# Patient Record
Sex: Female | Born: 1970
Health system: Southern US, Community
[De-identification: ages and names within clinical notes are randomized; demographics above are authoritative.]

## PROBLEM LIST (undated history)

## (undated) DIAGNOSIS — F329 Major depressive disorder, single episode, unspecified: Secondary | ICD-10-CM

## (undated) DIAGNOSIS — N920 Excessive and frequent menstruation with regular cycle: Secondary | ICD-10-CM

## (undated) DIAGNOSIS — K5730 Diverticulosis of large intestine without perforation or abscess without bleeding: Secondary | ICD-10-CM

## (undated) DIAGNOSIS — D250 Submucous leiomyoma of uterus: Secondary | ICD-10-CM

## (undated) DIAGNOSIS — K5909 Other constipation: Secondary | ICD-10-CM

## (undated) DIAGNOSIS — M1990 Unspecified osteoarthritis, unspecified site: Secondary | ICD-10-CM

## (undated) DIAGNOSIS — D649 Anemia, unspecified: Secondary | ICD-10-CM

## (undated) DIAGNOSIS — Z973 Presence of spectacles and contact lenses: Secondary | ICD-10-CM

## (undated) DIAGNOSIS — F32A Depression, unspecified: Secondary | ICD-10-CM

## (undated) DIAGNOSIS — K921 Melena: Secondary | ICD-10-CM

## (undated) DIAGNOSIS — F419 Anxiety disorder, unspecified: Secondary | ICD-10-CM

## (undated) DIAGNOSIS — K5792 Diverticulitis of intestine, part unspecified, without perforation or abscess without bleeding: Secondary | ICD-10-CM

## (undated) HISTORY — DX: Melena: K92.1

## (undated) HISTORY — PX: BREAST EXCISIONAL BIOPSY: SUR124

## (undated) HISTORY — PX: FOOT SURGERY: SHX648

## (undated) HISTORY — DX: Major depressive disorder, single episode, unspecified: F32.9

## (undated) HISTORY — DX: Unspecified osteoarthritis, unspecified site: M19.90

## (undated) HISTORY — PX: KIDNEY SURGERY: SHX687

## (undated) HISTORY — DX: Depression, unspecified: F32.A

---

## 2015-03-13 ENCOUNTER — Emergency Department (HOSPITAL_COMMUNITY)
Admission: EM | Admit: 2015-03-13 | Discharge: 2015-03-13 | Discharge status: Against Medical Advice | Payer: Self-pay | Attending: Emergency Medicine | Admitting: Emergency Medicine

## 2015-03-13 ENCOUNTER — Encounter (HOSPITAL_COMMUNITY): Payer: Self-pay | Admitting: Emergency Medicine

## 2015-03-13 DIAGNOSIS — R109 Unspecified abdominal pain: Secondary | ICD-10-CM | POA: Insufficient documentation

## 2015-03-13 HISTORY — DX: Anxiety disorder, unspecified: F41.9

## 2015-03-13 HISTORY — DX: Diverticulitis of intestine, part unspecified, without perforation or abscess without bleeding: K57.92

## 2015-03-13 LAB — CBC
HCT: 35.6 % — ABNORMAL LOW (ref 36.0–46.0)
Hemoglobin: 11.5 g/dL — ABNORMAL LOW (ref 12.0–15.0)
MCH: 28.3 pg (ref 26.0–34.0)
MCHC: 32.3 g/dL (ref 30.0–36.0)
MCV: 87.7 fL (ref 78.0–100.0)
Platelets: 225 10*3/uL (ref 150–400)
RBC: 4.06 MIL/uL (ref 3.87–5.11)
RDW: 13.6 % (ref 11.5–15.5)
WBC: 6.6 10*3/uL (ref 4.0–10.5)

## 2015-03-13 LAB — BASIC METABOLIC PANEL
Anion gap: 8 (ref 5–15)
BUN: 13 mg/dL (ref 6–20)
CO2: 24 mmol/L (ref 22–32)
Calcium: 9.1 mg/dL (ref 8.9–10.3)
Chloride: 106 mmol/L (ref 101–111)
Creatinine, Ser: 0.77 mg/dL (ref 0.44–1.00)
GFR calc Af Amer: >60 mL/min (ref >60)
GFR calc non Af Amer: >60 mL/min (ref >60)
Glucose, Bld: 83 mg/dL (ref 65–99)
Potassium: 3.7 mmol/L (ref 3.5–5.1)
Sodium: 138 mmol/L (ref 135–145)

## 2015-03-13 LAB — I-STAT BETA HCG BLOOD, ED (MC, WL, AP ONLY): I-stat hCG, quantitative: <5 m[IU]/mL (ref <5)

## 2015-03-13 NOTE — ED Notes (Signed)
Pt told registration she was leaving prior to being seen

## 2015-03-13 NOTE — ED Notes (Signed)
Pt reports L flank pain since yesterday. Pt thinks she saw some blood in your urine. No hx of kidney stones. Hx of ureteral deterioration several years ago.

## 2015-11-26 ENCOUNTER — Ambulatory Visit (INDEPENDENT_AMBULATORY_CARE_PROVIDER_SITE_OTHER): Payer: Self-pay

## 2015-11-26 ENCOUNTER — Ambulatory Visit (INDEPENDENT_AMBULATORY_CARE_PROVIDER_SITE_OTHER): Payer: Self-pay | Admitting: Urgent Care

## 2015-11-26 VITALS — BP 148/85 | HR 64 | Temp 98.8°F | Resp 16

## 2015-11-26 DIAGNOSIS — S91119A Laceration without foreign body of unspecified toe without damage to nail, initial encounter: Secondary | ICD-10-CM

## 2015-11-26 DIAGNOSIS — Z0289 Encounter for other administrative examinations: Secondary | ICD-10-CM

## 2015-11-26 DIAGNOSIS — S99921A Unspecified injury of right foot, initial encounter: Secondary | ICD-10-CM

## 2015-11-26 DIAGNOSIS — Z23 Encounter for immunization: Secondary | ICD-10-CM

## 2015-11-26 DIAGNOSIS — S6981XA Other specified injuries of right wrist, hand and finger(s), initial encounter: Secondary | ICD-10-CM | POA: Diagnosis not present

## 2015-11-26 MED ORDER — TRAMADOL HCL 50 MG PO TABS: 50.0000 mg | ORAL_TABLET | Freq: Three times a day (TID) | ORAL | 0 refills | Status: DC | PRN

## 2015-11-26 MED ORDER — CEPHALEXIN 500 MG PO CAPS: 500.0000 mg | ORAL_CAPSULE | Freq: Three times a day (TID) | ORAL | 0 refills | Status: DC

## 2015-11-26 NOTE — Patient Instructions (Addendum)
Laceration Care, Adult A laceration is a cut that goes through all of the layers of the skin and into the tissue that is right under the skin. Some lacerations heal on their own. Others need to be closed with stitches (sutures), staples, skin adhesive strips, or skin glue. Proper laceration care minimizes the risk of infection and helps the laceration to heal better. HOW TO CARE FOR YOUR LACERATION If sutures or staples were used:  Keep the wound clean and dry.  If you were given a bandage (dressing), you should change it at least one time per day or as told by your health care provider. You should also change it if it becomes wet or dirty.  Keep the wound completely dry for the first 24 hours or as told by your health care provider. After that time, you may shower or bathe. However, make sure that the wound is not soaked in water until after the sutures or staples have been removed.  Clean the wound one time each day or as told by your health care provider:  Wash the wound with soap and water.  Rinse the wound with water to remove all soap.  Pat the wound dry with a clean towel. Do not rub the wound.  After cleaning the wound, apply a thin layer of antibiotic ointmentas told by your health care provider. This will help to prevent infection and keep the dressing from sticking to the wound.  Have the sutures or staples removed as told by your health care provider. If skin adhesive strips were used:  Keep the wound clean and dry.  If you were given a bandage (dressing), you should change it at least one time per day or as told by your health care provider. You should also change it if it becomes dirty or wet.  Do not get the skin adhesive strips wet. You may shower or bathe, but be careful to keep the wound dry.  If the wound gets wet, pat it dry with a clean towel. Do not rub the wound.  Skin adhesive strips fall off on their own. You may trim the strips as the wound heals. Do not  remove skin adhesive strips that are still stuck to the wound. They will fall off in time. If skin glue was used:  Try to keep the wound dry, but you may briefly wet it in the shower or bath. Do not soak the wound in water, such as by swimming.  After you have showered or bathed, gently pat the wound dry with a clean towel. Do not rub the wound.  Do not do any activities that will make you sweat heavily until the skin glue has fallen off on its own.  Do not apply liquid, cream, or ointment medicine to the wound while the skin glue is in place. Using those may loosen the film before the wound has healed.  If you were given a bandage (dressing), you should change it at least one time per day or as told by your health care provider. You should also change it if it becomes dirty or wet.  If a dressing is placed over the wound, be careful not to apply tape directly over the skin glue. Doing that may cause the glue to be pulled off before the wound has healed.  Do not pick at the glue. The skin glue usually remains in place for 5-10 days, then it falls off of the skin. General Instructions  Take over-the-counter and prescription   medicines only as told by your health care provider.  If you were prescribed an antibiotic medicine or ointment, take or apply it as told by your doctor. Do not stop using it even if your condition improves.  To help prevent scarring, make sure to cover your wound with sunscreen whenever you are outside after stitches are removed, after adhesive strips are removed, or when glue remains in place and the wound is healed. Make sure to wear a sunscreen of at least 30 SPF.  Do not scratch or pick at the wound.  Keep all follow-up visits as told by your health care provider. This is important.  Check your wound every day for signs of infection. Watch for:  Redness, swelling, or pain.  Fluid, blood, or pus.  Raise (elevate) the injured area above the level of your heart  while you are sitting or lying down, if possible. SEEK MEDICAL CARE IF:  You received a tetanus shot and you have swelling, severe pain, redness, or bleeding at the injection site.  You have a fever.  A wound that was closed breaks open.  You notice a bad smell coming from your wound or your dressing.  You notice something coming out of the wound, such as wood or glass.  Your pain is not controlled with medicine.  You have increased redness, swelling, or pain at the site of your wound.  You have fluid, blood, or pus coming from your wound.  You notice a change in the color of your skin near your wound.  You need to change the dressing frequently due to fluid, blood, or pus draining from the wound.  You develop a new rash.  You develop numbness around the wound. SEEK IMMEDIATE MEDICAL CARE IF:  You develop severe swelling around the wound.  Your pain suddenly increases and is severe.  You develop painful lumps near the wound or on skin that is anywhere on your body.  You have a red streak going away from your wound.  The wound is on your hand or foot and you cannot properly move a finger or toe.  The wound is on your hand or foot and you notice that your fingers or toes look pale or bluish.   This information is not intended to replace advice given to you by your health care provider. Make sure you discuss any questions you have with your health care provider.   Document Released: 03/03/2005 Document Revised: 07/18/2014 Document Reviewed: 02/27/2014 Elsevier Interactive Patient Education 2016 [place] or Toenail Removal Fingernail or toenail removal is a surgical procedure to take off a nail from your finger or your toe. You may need to have a fingernail or toenail removed if it has an abnormal shape (deformity) or if it is severely injured. A fingernail or toenail may also be removed due to a bacterial infection, a severe ingrown toenail, or a  fungal infection that has failed treatment with antifungal medicines. LET [facility] CARE PROVIDER KNOW ABOUT:  Any allergies you have.  All medicines you are taking, including vitamins, herbs, eye drops, creams, and over-the-counter medicines.  Previous problems you or members of your family have had with the use of anesthetics.  Any blood disorders you have.  Previous surgeries you have had.  Any medical conditions you may have. RISKS AND COMPLICATIONS Generally, this is a safe procedure. However, problems may occur, including:  Pain.  Bleeding.  Infection.  Regrowth of a deformed nail. BEFORE THE  PROCEDURE  Ask your health care provider about changing or stopping your regular medicines. This is especially important if you are taking diabetes medicines or blood thinners.  Follow instructions from your health care provider about eating or drinking restrictions.  Plan to have someone take you home after the procedure. PROCEDURE  An IV tube will be inserted into one of your veins.  You will be given one or more of the following:  A medicine that helps you relax (sedative).  A medicine that numbs the area (local anesthetic).  After your toe or finger is numb, your health care provider will insert a blunt instrument under your nail to lift it up.  In some cases, your health care provider may also make a cut (incision) in your nail.  After your nail is lifted away from your toe or finger, your health care provider will detach it from your nail bed.  A germ-killing bandage (antiseptic dressing) will be put on your toe or finger. The procedure may vary among health care providers and hospitals. AFTER THE PROCEDURE  Your blood pressure, heart rate, breathing rate, and blood oxygen level will be monitored often until the medicines you were given have worn off.  It is common to have some pain after nail removal. You will be given pain medicine as needed.  You may be  given a prescription for pain medicine and antibiotic medicine.  If you had a toenail removed, you will be given a surgical shoe to wear while you recover.  If you had a fingernail removed, you may be given a finger splint to wear while you recover.   This information is not intended to replace advice given to you by your health care provider. Make sure you discuss any questions you have with your health care provider.   Document Released: 11/30/2002 Document Revised: 07/18/2014 Document Reviewed: 03/01/2014 Elsevier Interactive Patient Education 2016 [employer].    IF you received an x-ray today, you will receive an invoice from [facility]. Please contact [facility] at [phone] with questions or concerns regarding your invoice.   IF you received labwork today, you will receive an invoice from [employer]. Please contact [facility] at [phone] with questions or concerns regarding your invoice.   Our billing staff will not be able to assist you with questions regarding bills from these companies.  You will be contacted with the lab results as soon as they are available. The fastest way to get your results is to activate your My Chart account. Instructions are located on the last page of this paperwork. If you have not heard from [place] regarding the results in 2 weeks, please contact this office.

## 2015-11-26 NOTE — Progress Notes (Signed)
Partial Toe Nail Removal and Laceration repair  Verbal informed consent obtained. Patient was anesthetized and right toe irrigated prior to procedure by [employer].  Right outer lateral to medial portion of toe nail removed without further tearing of the nailbed.  Two interrupted nylon, dissolvable, sutures placed to nail bed laceration. Two 4.0 prolene interrupted sutures placed right outer upper lateral toe. Hemostasis achieved.Dressed with guaze and secured with coban. Patient tolerated procedure.  Sheri [name]. Kenton Kingfisher, MSN, FNP-C [facility]

## 2015-11-26 NOTE — Progress Notes (Signed)
    MRN: [id number] DOB: 05-07-1970  Subjective:   Sheri [name] is a 45 y.o. female presenting for chief complaint of Toe Injury (right toe, hit it on door at work )  Reports suffering a toe injury while walking into her building at work today. Patient opened the door and trapped her right great toe underneath it. She gently removed her toe from under the door and noticed her nail had broken. She started to bleed profusely and has had worsening pain since then. The patient was able to clean her toe with iodine and wrapped it in gauze prior to coming in. She reports her pain level at 5/10 with shooting, sharp pain that goes to 10/10. She cannot recall when her last TDAP was.   [facility] currently has no medications in their medication list. Also has No Known Allergies.  Sheri [name]  has a past medical history of Anxiety; Arthritis; Depression; and Diverticulitis. Also  has a past surgical history that includes Kidney surgery and Foot surgery.  Objective:   Vitals: BP (!) 148/85 (BP Location: Left Arm, Patient Position: Sitting, Cuff Size: Normal)   Pulse 64   Temp 98.8 F (37.1 C) (Oral)   Resp 16   LMP 11/20/2015   SpO2 100%   Physical Exam  Constitutional: She is oriented to person, place, and time. She appears well-developed and well-nourished.  Cardiovascular: Normal rate.   Pulmonary/Chest: Effort normal.  Musculoskeletal:       Right foot: There is decreased range of motion, tenderness, swelling (trace) and laceration (over medial right great toe with associated nail bed laceration).  Neurological: She is alert and oriented to person, place, and time.   Dg Toe Great Right  Result Date: 11/26/2015 CLINICAL DATA:  Crush injury to the toe at work. EXAM: RIGHT GREAT TOE COMPARISON:  None. FINDINGS: Distal phalanx is normal. There is chronic arthropathy at the metatarsal phalangeal joint with an old surgical screw in the distal metatarsal. There are osteophytes circumferentially affecting this  joint. Medially, there is a linear lucency in the osteophyte that could be chronic or represent an osteophyte fracture. IMPRESSION: Chronic degenerative arthropathy at the metatarsal phalangeal joint of the great toe. Linear lucency within the medial osteophyte of the proximal phalanx which could be chronic and nontraumatic or could represent an osteophyte fracture. Electronically Signed   By: Nelson Chimes M.D.   On: 11/26/2015 11:28   Assessment and Plan :   1. Toe injury, right, initial encounter 2. Toe laceration, initial encounter 3. Nailbed injury, right, initial encounter 4. Need for Tdap vaccination - Laceration repaired by Nurse Practitioner Lavell Anchors under the direct supervision of PA-Chelle Jacqulynn Cadet, procedure note to follow. Wound care reviewed. Start Keflex given possible osteophyte fracture. Use ibuprofen and APAP for pain and inflammation, Tramadol for breakthrough pain. Wear post-op shoe. Patient is to rtc in 1 week for recheck. TDAP updated today.  Jaynee Eagles, PA-C [facility] [phone] 11/26/2015 11:07 AM

## 2015-12-03 ENCOUNTER — Ambulatory Visit (INDEPENDENT_AMBULATORY_CARE_PROVIDER_SITE_OTHER): Payer: Worker's Compensation | Admitting: Physician Assistant

## 2015-12-03 VITALS — BP 122/70 | HR 70 | Temp 98.7°F | Resp 18

## 2015-12-03 DIAGNOSIS — S91119D Laceration without foreign body of unspecified toe without damage to nail, subsequent encounter: Secondary | ICD-10-CM

## 2015-12-03 DIAGNOSIS — S99921D Unspecified injury of right foot, subsequent encounter: Secondary | ICD-10-CM

## 2015-12-03 DIAGNOSIS — S6981XD Other specified injuries of right wrist, hand and finger(s), subsequent encounter: Secondary | ICD-10-CM

## 2015-12-03 NOTE — Patient Instructions (Addendum)
WOUND CARE  . Every 24 hours, remove bandage and wash wound gently with mild soap and warm water. Reapply a new bandage after cleaning wound, if you will be going out and about. When you can (especially in the evenings at home), leave the wound open to air. . Continue daily cleansing with soap and water until The wound is healed. . You may apply any ointments or creams to the wound . Notify the office if you experience any of the following signs of infection: Swelling, redness, pus drainage, streaking, fever >101.0 F . Notify the office if you experience excessive bleeding that does not stop after 15-20 minutes of constant, firm pressure.      IF you received an x-ray today, you will receive an invoice from [facility]. Please contact [facility] at [phone] with questions or concerns regarding your invoice.   IF you received labwork today, you will receive an invoice from [employer]. Please contact [facility] at [phone] with questions or concerns regarding your invoice.   Our billing staff will not be able to assist you with questions regarding bills from these companies.  You will be contacted with the lab results as soon as they are available. The fastest way to get your results is to activate your My Chart account. Instructions are located on the last page of this paperwork. If you have not heard from [place] regarding the results in 2 weeks, please contact this office.

## 2015-12-03 NOTE — Progress Notes (Signed)
   Subjective:    Patient ID: Sheri [name], female    DOB: 11/16/1970, 45 y.o.   MRN: [id number] Chief Complaint  Patient presents with  . Suture / Staple Removal    HPI Patient presents today for suture removal. Patient was initially seen 11/26/2015 for an injury to the right 1st toe she sustained at work. On the day she first came in she received an x-ray which displayed possible fracture of an osteophyte in the great right toe. She was schedule today for a follow-up, patient has not changed dressing in 7 days, bandage adhered to nail bed and had to soak foot to remove. Ste of suture is healing well. Still has occasional throbbing pain that is relieved by tylenol, has been using right post-op shoe. She reports minor blood stains on dressing, and denies fever, drainage of pus or blood, wound dehiscence, edema.    Review of Systems No other significant ROS except those stated above  No Known Allergies Prior to Admission medications   Medication Sig Start Date End Date Taking? Authorizing Provider  cephALEXin (KEFLEX) 500 MG capsule Take 1 capsule (500 mg total) by mouth 3 (three) times daily. 11/26/15  Yes Jaynee Eagles, PA-C  traMADol (ULTRAM) 50 MG tablet Take 1 tablet (50 mg total) by mouth every 8 (eight) hours as needed for severe pain. 11/26/15  Yes Jaynee Eagles, PA-C   There are no active problems to display for this patient.      Objective:   Physical Exam  BP 122/70 (BP Location: Right Arm, Patient Position: Sitting, Cuff Size: Small)   Pulse 70   Temp 98.7 F (37.1 C) (Oral)   Resp 18   LMP 11/20/2015   SpO2 99%  Constitutional: Well-appearing female, no distress  HEENT:  Head: Normocephalic, atraumatic   Skin: The right great toe was missing 1/4 of the toenail sustained from the original injury. Slight bleeding occurred after removal of the wrapping. No erythema, edema, or drainage. No signs of infection, tissue is granulating well.       Assessment & Plan:  1. Suture  removal/Follow-up Patient's nail bed is healing well. 2  Etheylon sutures were removed from great toe. Patient no longer needs to come in for follow-up. Wound care instructions were given

## 2015-12-05 NOTE — Progress Notes (Signed)
Chief Complaint  Patient presents with  . Suture / Staple Removal    History of Present Illness: Patient presents for suture removal.  She was seen following injury to the RIGHT great toe at work on 11/26/2015.  Xray at the initial visit revealed possible acute fracture of an osteophyte of the toe, and so in addition to the wound repair, she was placed in a post-op shoe.  She presents today, as instructed, for suture removal. Unfortunately, she did not understand the instructions to remove the dressing after 24 hours and to wash daily. The original dressing remains in place.  Intermittent throbbing pain is relieved by OTC acetaminophen. No fever, chills.   Medications, allergies, active problem list and history reviewed.   Physical Exam  Constitutional: She is oriented to person, place, and time. She appears well-developed and well-nourished. She is active and cooperative. No distress.  BP 122/70 (BP Location: Right Arm, Patient Position: Sitting, Cuff Size: Small)   Pulse 70   Temp 98.7 F (37.1 C) (Oral)   Resp 18   LMP 11/20/2015   SpO2 99%    Eyes: Conjunctivae are normal.  Pulmonary/Chest: Effort normal.  Neurological: She is alert and oriented to person, place, and time.  Psychiatric: She has a normal mood and affect. Her speech is normal and behavior is normal.   Dressing is adherent to the nail bed by dried blood. The toe was soaked in water, and then the dressing was removed with minimal discomfort.  The wound is healing well, granulating. No erythema, edema, drainage, induration. Mildly tender.  Ethilon sutures removed without incidence. Vicryl sutures of the nail bed wound are not visible and wound there appears completely healed.  Cleansed and dry dressing applied.   ASSESSMENT & PLAN:  1. Toe injury, right, subsequent encounter 2. Toe laceration, subsequent encounter 3. Nailbed injury, right, subsequent encounter Local wound care reviewed again. May apply  OTC antibiotic ointment. Recommend leaving the wound open to the air when she is at home, covering with a simple bandaid at work. Continue in post-op shoe for another week, and test weight bearing without the shoe when bathing. She may gradually reduce use as able without pain.   Fara Chute, PA-C Physician Assistant-[facility]ified [facility]

## 2017-10-01 ENCOUNTER — Encounter (HOSPITAL_BASED_OUTPATIENT_CLINIC_OR_DEPARTMENT_OTHER): Payer: Self-pay

## 2017-10-01 ENCOUNTER — Emergency Department (HOSPITAL_BASED_OUTPATIENT_CLINIC_OR_DEPARTMENT_OTHER)
Admission: EM | Admit: 2017-10-01 | Discharge: 2017-10-01 | Disposition: A | Discharge status: Home / Self Care | Payer: BLUE CROSS/BLUE SHIELD | Attending: Emergency Medicine | Admitting: Emergency Medicine

## 2017-10-01 ENCOUNTER — Other Ambulatory Visit: Payer: Self-pay

## 2017-10-01 DIAGNOSIS — J209 Acute bronchitis, unspecified: Secondary | ICD-10-CM | POA: Insufficient documentation

## 2017-10-01 DIAGNOSIS — Z79899 Other long term (current) drug therapy: Secondary | ICD-10-CM | POA: Insufficient documentation

## 2017-10-01 DIAGNOSIS — R05 Cough: Secondary | ICD-10-CM | POA: Diagnosis present

## 2017-10-01 MED ORDER — AZITHROMYCIN 250 MG PO TABS: ORAL_TABLET | ORAL | 0 refills | Status: DC

## 2017-10-01 NOTE — Discharge Instructions (Addendum)
Zithromax as prescribed.  Continue over-the-counter medications as needed for symptom relief.  Return to the emergency department if symptoms significantly worsen or change.

## 2017-10-01 NOTE — ED Triage Notes (Addendum)
C/o flu like sx x 1 week-NAD-steady gait 

## 2017-10-01 NOTE — ED Provider Notes (Signed)
[facility] EMERGENCY DEPARTMENT Provider Note   CSN: [id number] Arrival date & time: 10/01/17  1711     History   Chief Complaint Chief Complaint  Patient presents with  . Cough    HPI Sheri [name] is a 47 y.o. female.  Patient is a 47 year old female with history of anxiety and diverticulitis.  She presents with URI symptoms for the past 10 days.  She reports returning home from a cruise shortly prior to the onset of her illness.  She reports congestion in her chest and cough that is nonproductive.  She denies any shortness of breath.  She does report fevers and chills during this period of time.  She has tried Mucinex over-the-counter with little relief.  The history is provided by the patient.  Cough  This is a new problem. Episode onset: 10 days ago. The problem occurs constantly. The problem has been gradually worsening. The cough is non-productive. Maximum temperature: Feels chilled, however has not taken temperature. Associated symptoms include chills. Pertinent negatives include no shortness of breath and no wheezing. She has tried nothing for the symptoms.    Past Medical History:  Diagnosis Date  . Anxiety   . Arthritis   . Depression   . Diverticulitis     There are no active problems to display for this patient.   Past Surgical History:  Procedure Laterality Date  . FOOT SURGERY    . KIDNEY SURGERY       OB History   None      Home Medications    Prior to Admission medications   Medication Sig Start Date End Date Taking? Authorizing Provider  cephALEXin (KEFLEX) 500 MG capsule Take 1 capsule (500 mg total) by mouth 3 (three) times daily. 11/26/15   Jaynee Eagles, PA-C  traMADol (ULTRAM) 50 MG tablet Take 1 tablet (50 mg total) by mouth every 8 (eight) hours as needed for severe pain. 11/26/15   Jaynee Eagles, PA-C    Family History No family history on file.  Social History Social History   Tobacco Use  . Smoking status: Never Smoker  .  Smokeless tobacco: Never Used  Substance Use Topics  . Alcohol use: No  . Drug use: No     Allergies   Patient has no known allergies.   Review of Systems Review of Systems  Constitutional: Positive for chills.  Respiratory: Positive for cough. Negative for shortness of breath and wheezing.   All other systems reviewed and are negative.    Physical Exam Updated Vital Signs BP (!) 148/80 (BP Location: Left Arm)   Pulse 85   Temp 98.8 F (37.1 C) (Oral)   Resp 18   Ht 5\' 5"  (1.651 m)   Wt 83.5 kg (184 lb)   LMP 09/21/2017   SpO2 100%   BMI 30.62 kg/m   Physical Exam  Constitutional: She is oriented to person, place, and time. She appears well-developed and well-nourished. No distress.  HENT:  Head: Normocephalic and atraumatic.  Mouth/Throat: Oropharynx is clear and moist.  Neck: Normal range of motion. Neck supple.  Cardiovascular: Normal rate and regular rhythm. Exam reveals no gallop and no friction rub.  No murmur heard. Pulmonary/Chest: Effort normal and breath sounds normal. No respiratory distress. She has no wheezes.  Abdominal: Soft. Bowel sounds are normal. She exhibits no distension. There is no tenderness.  Musculoskeletal: Normal range of motion.  Neurological: She is alert and oriented to person, place, and time.  Skin: Skin is  warm and dry. She is not diaphoretic.  Nursing note and vitals reviewed.    ED Treatments / Results  Labs (all labs ordered are listed, but only abnormal results are displayed) Labs Reviewed - No data to display  EKG None  Radiology No results found.  Procedures Procedures (including critical care time)  Medications Ordered in ED Medications - No data to display   Initial Impression / Assessment and Plan / ED Course  I have reviewed the triage vital signs and the nursing notes.  Pertinent labs & imaging results that were available during my care of the patient were reviewed by me and considered in my medical  decision making (see chart for details).  Patient with URI symptoms that have been persistent and worsening for the past 10 days.  She has had no relief with over-the-counter medications.  This will be treated as a bronchitis with Zithromax.  She is to continue over-the-counter medications and return/follow-up as needed.  Final Clinical Impressions(s) / ED Diagnoses   Final diagnoses:  None    ED Discharge Orders    None       Veryl Speak, MD 10/01/17 1743

## 2017-10-13 ENCOUNTER — Emergency Department (HOSPITAL_COMMUNITY)
Admission: EM | Admit: 2017-10-13 | Discharge: 2017-10-13 | Disposition: A | Discharge status: Home / Self Care | Payer: BLUE CROSS/BLUE SHIELD | Attending: Emergency Medicine | Admitting: Emergency Medicine

## 2017-10-13 ENCOUNTER — Emergency Department (HOSPITAL_COMMUNITY): Payer: BLUE CROSS/BLUE SHIELD

## 2017-10-13 ENCOUNTER — Encounter (HOSPITAL_COMMUNITY): Payer: Self-pay

## 2017-10-13 DIAGNOSIS — R109 Unspecified abdominal pain: Secondary | ICD-10-CM | POA: Diagnosis not present

## 2017-10-13 DIAGNOSIS — R319 Hematuria, unspecified: Secondary | ICD-10-CM | POA: Insufficient documentation

## 2017-10-13 LAB — BASIC METABOLIC PANEL
Anion gap: 11 (ref 5–15)
BUN: 12 mg/dL (ref 6–20)
CO2: 19 mmol/L — ABNORMAL LOW (ref 22–32)
Calcium: 9.5 mg/dL (ref 8.9–10.3)
Chloride: 108 mmol/L (ref 98–111)
Creatinine, Ser: 1.08 mg/dL — ABNORMAL HIGH (ref 0.44–1.00)
GFR calc Af Amer: >60 mL/min (ref >60)
GFR calc non Af Amer: >60 mL/min (ref >60)
Glucose, Bld: 107 mg/dL — ABNORMAL HIGH (ref 70–99)
Potassium: 3.6 mmol/L (ref 3.5–5.1)
Sodium: 138 mmol/L (ref 135–145)

## 2017-10-13 LAB — CBC WITH DIFFERENTIAL/PLATELET
Abs Immature Granulocytes: 0.1 10*3/uL (ref 0.0–0.1)
Basophils Absolute: 0.1 10*3/uL (ref 0.0–0.1)
Basophils Relative: 1 %
Eosinophils Absolute: 0.1 10*3/uL (ref 0.0–0.7)
Eosinophils Relative: 1 %
HCT: 35.4 % — ABNORMAL LOW (ref 36.0–46.0)
Hemoglobin: 11.3 g/dL — ABNORMAL LOW (ref 12.0–15.0)
Immature Granulocytes: 0 %
Lymphocytes Relative: 12 %
Lymphs Abs: 1.4 10*3/uL (ref 0.7–4.0)
MCH: 29.3 pg (ref 26.0–34.0)
MCHC: 31.9 g/dL (ref 30.0–36.0)
MCV: 91.7 fL (ref 78.0–100.0)
Monocytes Absolute: 0.6 10*3/uL (ref 0.1–1.0)
Monocytes Relative: 5 %
Neutro Abs: 9.7 10*3/uL — ABNORMAL HIGH (ref 1.7–7.7)
Neutrophils Relative %: 81 %
Platelets: 249 10*3/uL (ref 150–400)
RBC: 3.86 MIL/uL — ABNORMAL LOW (ref 3.87–5.11)
RDW: 13.5 % (ref 11.5–15.5)
WBC: 12 10*3/uL — ABNORMAL HIGH (ref 4.0–10.5)

## 2017-10-13 LAB — URINALYSIS, ROUTINE W REFLEX MICROSCOPIC
Bacteria, UA: NONE SEEN
RBC / HPF: >50 RBC/hpf — ABNORMAL HIGH (ref 0–5)

## 2017-10-13 LAB — POC URINE PREG, ED: Preg Test, Ur: NEGATIVE

## 2017-10-13 LAB — PREGNANCY, URINE: Preg Test, Ur: NEGATIVE

## 2017-10-13 MED ORDER — ONDANSETRON HCL 4 MG/2ML IJ SOLN
4.0000 mg | Freq: Once | INTRAMUSCULAR | Status: AC
  Administered 2017-10-13: 4 mg via INTRAVENOUS
  Filled 2017-10-13: qty 2

## 2017-10-13 MED ORDER — HYDROMORPHONE HCL 1 MG/ML IJ SOLN
1.0000 mg | Freq: Once | INTRAMUSCULAR | Status: AC
  Administered 2017-10-13: 1 mg via INTRAVENOUS
  Filled 2017-10-13: qty 1

## 2017-10-13 MED ORDER — ONDANSETRON HCL 4 MG/2ML IJ SOLN
4.0000 mg | Freq: Once | INTRAMUSCULAR | Status: AC
  Administered 2017-10-13: 4 mg via INTRAVENOUS

## 2017-10-13 MED ORDER — ONDANSETRON 4 MG PO TBDP: 4.0000 mg | ORAL_TABLET | Freq: Three times a day (TID) | ORAL | 0 refills | Status: DC | PRN

## 2017-10-13 MED ORDER — HYDROMORPHONE HCL 1 MG/ML IJ SOLN
1.0000 mg | Freq: Once | INTRAMUSCULAR | Status: AC
  Administered 2017-10-13: 1 mg via INTRAVENOUS

## 2017-10-13 MED ORDER — OXYCODONE-ACETAMINOPHEN 5-325 MG PO TABS: 1.0000 | ORAL_TABLET | ORAL | 0 refills | Status: DC | PRN

## 2017-10-13 NOTE — ED Notes (Signed)
POC not crossing over, negative. Pregnancy sent to main lab for result.

## 2017-10-13 NOTE — ED Notes (Signed)
CT called and made aware.

## 2017-10-13 NOTE — Discharge Instructions (Signed)
Take the prescribed medication as directed. Follow-up with urology-- call office today to schedule appt. Return to the ED for new or worsening symptoms.

## 2017-10-13 NOTE — ED Provider Notes (Signed)
[facility] EMERGENCY DEPARTMENT Provider Note   CSN: [id number] Arrival date & time: 10/13/17  0139     History   Chief Complaint Chief Complaint  Patient presents with  . Hematuria  . Flank Pain    HPI Sheri [name] is a 47 y.o. female.  The history is provided by the patient and medical records.  Hematuria   Flank Pain     47 y.o. F with hx of anxiety, arthritis, depression, diverticulitis, presenting to the ED for left flank pain.  Patient reports for the past week she has been having intermittent right flank pain.  States tonight it got acutely worse, has been more constant, and now having hematuria.  States blood bright red in color with passage of clots.  Pain remains localized to left flank, no radiation.  Reports nausea but denies vomiting.  No fever/chills.  States she had hx of similar several years ago when still living in [place].  Seen in the ED there, admitted but never told what it was but reports they ruled out kidney stones.   Past Medical History:  Diagnosis Date  . Anxiety   . Arthritis   . Depression   . Diverticulitis     There are no active problems to display for this patient.   Past Surgical History:  Procedure Laterality Date  . FOOT SURGERY    . KIDNEY SURGERY       OB History   None      Home Medications    Prior to Admission medications   Medication Sig Start Date End Date Taking? Authorizing Provider  azithromycin (ZITHROMAX Z-PAK) 250 MG tablet 2 po day one, then 1 daily x 4 days 10/01/17   Veryl Speak, MD  cephALEXin (KEFLEX) 500 MG capsule Take 1 capsule (500 mg total) by mouth 3 (three) times daily. 11/26/15   Jaynee Eagles, PA-C  traMADol (ULTRAM) 50 MG tablet Take 1 tablet (50 mg total) by mouth every 8 (eight) hours as needed for severe pain. 11/26/15   Jaynee Eagles, PA-C    Family History No family history on file.  Social History Social History   Tobacco Use  . Smoking status: Never Smoker  . Smokeless  tobacco: Never Used  Substance Use Topics  . Alcohol use: No  . Drug use: No     Allergies   Patient has no known allergies.   Review of Systems Review of Systems  Genitourinary: Positive for flank pain and hematuria.  All other systems reviewed and are negative.    Physical Exam Updated Vital Signs BP (!) 164/98 (BP Location: Right Arm)   Pulse 88   Temp 99.7 F (37.6 C) (Oral)   Resp 18   Ht 5\' 5"  (1.651 m)   LMP 09/21/2017   SpO2 98%   BMI 30.62 kg/m   Physical Exam  Constitutional: She is oriented to person, place, and time. She appears well-developed and well-nourished.  Appears uncomfortable, wincing  HENT:  Head: Normocephalic and atraumatic.  Mouth/Throat: Oropharynx is clear and moist.  Eyes: Pupils are equal, round, and reactive to light. Conjunctivae and EOM are normal.  Neck: Normal range of motion.  Cardiovascular: Normal rate, regular rhythm and normal heart sounds.  Pulmonary/Chest: Effort normal and breath sounds normal. No stridor. No respiratory distress.  Abdominal: Soft. Bowel sounds are normal. There is no tenderness. There is CVA tenderness (left). There is no rebound.  Musculoskeletal: Normal range of motion.  Neurological: She is alert and oriented  to person, place, and time.  Skin: Skin is warm and dry.  Psychiatric: She has a normal mood and affect.  Nursing note and vitals reviewed.    ED Treatments / Results  Labs (all labs ordered are listed, but only abnormal results are displayed) Labs Reviewed  URINALYSIS, ROUTINE W REFLEX MICROSCOPIC - Abnormal; Notable for the following components:      Result Value   Color, Urine RED (*)    APPearance TURBID (*)    Glucose, UA   (*)    Value: TEST NOT REPORTED DUE TO COLOR INTERFERENCE OF URINE PIGMENT   Hgb urine dipstick   (*)    Value: TEST NOT REPORTED DUE TO COLOR INTERFERENCE OF URINE PIGMENT   Bilirubin Urine   (*)    Value: TEST NOT REPORTED DUE TO COLOR INTERFERENCE OF URINE  PIGMENT   Ketones, ur   (*)    Value: TEST NOT REPORTED DUE TO COLOR INTERFERENCE OF URINE PIGMENT   Protein, ur   (*)    Value: TEST NOT REPORTED DUE TO COLOR INTERFERENCE OF URINE PIGMENT   Nitrite   (*)    Value: TEST NOT REPORTED DUE TO COLOR INTERFERENCE OF URINE PIGMENT   Leukocytes, UA   (*)    Value: TEST NOT REPORTED DUE TO COLOR INTERFERENCE OF URINE PIGMENT   RBC / HPF >50 (*)    All other components within normal limits  CBC WITH DIFFERENTIAL/PLATELET - Abnormal; Notable for the following components:   WBC 12.0 (*)    RBC 3.86 (*)    Hemoglobin 11.3 (*)    HCT 35.4 (*)    Neutro Abs 9.7 (*)    All other components within normal limits  BASIC METABOLIC PANEL - Abnormal; Notable for the following components:   CO2 19 (*)    Glucose, Bld 107 (*)    Creatinine, Ser 1.08 (*)    All other components within normal limits  URINE CULTURE  PREGNANCY, URINE  POC URINE PREG, ED    EKG None  Radiology Ct Renal Stone Study  Result Date: 10/13/2017 CLINICAL DATA:  Hematuria, LEFT flank pain with nausea and vomiting for a week. EXAM: CT ABDOMEN AND PELVIS WITHOUT CONTRAST TECHNIQUE: Multidetector CT imaging of the abdomen and pelvis was performed following the standard protocol without IV contrast. COMPARISON:  None. FINDINGS: LOWER CHEST: Lung bases are clear. The visualized heart size is normal. No pericardial effusion. HEPATOBILIARY: Normal. PANCREAS: Normal. SPLEEN: Normal. ADRENALS/URINARY TRACT: Kidneys are orthotopic, demonstrating normal size and morphology. Dense material at ureteropelvic junction resulting in mild hydronephrosis. No nephrolithiasis; limited assessment for renal masses by nonenhanced CT. The unopacified ureters are normal in course and caliber. Urinary bladder is partially distended and unremarkable. Normal adrenal glands. STOMACH/BOWEL: Mild gas distended distal esophagus associated with reflux. The stomach, small and large bowel are normal in course and  caliber without inflammatory changes, sensitivity decreased by lack of enteric contrast. Normal appendix. VASCULAR/LYMPHATIC: Aortoiliac vessels are normal in course and caliber. Mild colonic diverticulosis. No lymphadenopathy by CT size criteria. REPRODUCTIVE: Enlarged lobulated uterus. Subcentimeter exophytic subserosal leiomyoma. IUD in lower uterine segment. Phlebolith LEFT ovarian vein OTHER: No intraperitoneal free fluid or free air. MUSCULOSKELETAL: Non-acute. IMPRESSION: 1. Mild hydronephrosis with dense clot at ureteropelvic junction. Recommend follow-up as hemorrhagic mass is possible. 2. No urolithiasis. 3. Enlarged likely leiomyomatous uterus. IUD in lower uterine segment, recommend non emergent gynecologic consultation. Electronically Signed   By: Elon Alas M.D.   On: 10/13/2017  05:04    Procedures Procedures (including critical care time)  Medications Ordered in ED Medications  HYDROmorphone (DILAUDID) injection 1 mg (1 mg Intravenous Given 10/13/17 0236)  ondansetron (ZOFRAN) injection 4 mg (4 mg Intravenous Given 10/13/17 0236)  HYDROmorphone (DILAUDID) injection 1 mg (1 mg Intravenous Given 10/13/17 0525)  ondansetron (ZOFRAN) injection 4 mg (4 mg Intravenous Given 10/13/17 0525)     Initial Impression / Assessment and Plan / ED Course  I have reviewed the triage vital signs and the nursing notes.  Pertinent labs & imaging results that were available during my care of the patient were reviewed by me and considered in my medical decision making (see chart for details).  47 year old female presenting to the ED with hematuria and left flank pain.  Denies history of.  She is afebrile and nontoxic but does appear uncomfortable.  Left CVA tenderness.  Clinical concern for stone.  UA has been sent.  Will obtain basic labs and CT renal study.  Pain and nausea medications ordered.  Labs overall reassuring.  UA could not fully be analyzed due to amount of blood but no bacteria have  been visualized.  Culture pending.  CT with left-sided hydronephrosis with clot at the UPJ.  No visualized stone.  This may be source of patient's pain.  She will need close follow-up with urology as radiology reports hemorrhagic masses possible.  She reports some recurrence of her pain, will give additional meds here.  Will need continued symptomatic control at home as well.   She will return here for any new/acute changes.  Final Clinical Impressions(s) / ED Diagnoses   Final diagnoses:  Hematuria, unspecified type  Left flank pain    ED Discharge Orders        Ordered    oxyCODONE-acetaminophen (PERCOCET) 5-325 MG tablet  Every 4 hours PRN     10/13/17 0524    ondansetron (ZOFRAN ODT) 4 MG disintegrating tablet  Every 8 hours PRN     10/13/17 0524       Larene Pickett, PA-C 10/13/17 7124    Ezequiel Essex, MD 10/13/17 [phone]

## 2017-10-13 NOTE — ED Notes (Signed)
ED Provider at bedside. 

## 2017-10-13 NOTE — ED Triage Notes (Signed)
Pt states that for the past week she has been having hematuria and L sided flank pain with n/v

## 2017-10-14 LAB — URINE CULTURE

## 2018-05-24 ENCOUNTER — Encounter: Payer: Self-pay | Admitting: Family Medicine

## 2018-05-24 ENCOUNTER — Ambulatory Visit: Payer: Self-pay | Admitting: Family Medicine

## 2018-05-24 VITALS — BP 110/80 | HR 78 | Temp 99.1°F | Ht 64.0 in | Wt 184.0 lb

## 2018-05-24 DIAGNOSIS — B9789 Other viral agents as the cause of diseases classified elsewhere: Secondary | ICD-10-CM

## 2018-05-24 DIAGNOSIS — M1990 Unspecified osteoarthritis, unspecified site: Secondary | ICD-10-CM | POA: Diagnosis not present

## 2018-05-24 DIAGNOSIS — Z8659 Personal history of other mental and behavioral disorders: Secondary | ICD-10-CM | POA: Diagnosis not present

## 2018-05-24 DIAGNOSIS — Z8719 Personal history of other diseases of the digestive system: Secondary | ICD-10-CM | POA: Insufficient documentation

## 2018-05-24 DIAGNOSIS — Z7689 Persons encountering health services in other specified circumstances: Secondary | ICD-10-CM

## 2018-05-24 DIAGNOSIS — J069 Acute upper respiratory infection, unspecified: Secondary | ICD-10-CM | POA: Diagnosis not present

## 2018-05-24 DIAGNOSIS — K5909 Other constipation: Secondary | ICD-10-CM | POA: Diagnosis not present

## 2018-05-24 LAB — POCT INFLUENZA A/B
Influenza A, POC: NEGATIVE
Influenza B, POC: NEGATIVE

## 2018-05-24 MED ORDER — LINACLOTIDE 145 MCG PO CAPS: 145.0000 ug | ORAL_CAPSULE | Freq: Every day | ORAL | 3 refills | Status: DC

## 2018-05-24 MED ORDER — BENZONATATE 100 MG PO CAPS: 100.0000 mg | ORAL_CAPSULE | Freq: Two times a day (BID) | ORAL | 0 refills | Status: DC | PRN

## 2018-05-24 NOTE — Progress Notes (Signed)
Patient presents to clinic today to establish care.  SUBJECTIVE: PMH: Pt is a 48 yo female with pmh sig for arthritis, h/o depression, constipation, and diverticulosis.  Patient previously seen by Osborne Oman at [facility]  Acute concern: -endorses coughing, sneezing, sore throat, rhinorrhea, headache, low-grade fever  -x1 week. -Has tried DayQuil, cough drops -Sick contacts include coworkers -Denies recent travel abroad  Arthritis: -Mostly in feet. -Patient ran track in the past -Had a surgery on her right foot to "clean it out" -Does not typically have to take anything for the pain.  History of diverticulosis: -Noted on colonoscopy at age 37 for hemorrhoids -Notes history of constipation -Was on Linzess in the past which helped -May have a BM q. 1 to 2 weeks -Drinking 3-4 bottles of water per day -Trying to increase p.o. intake of vegetables  History of depression: -States has been dealing with all her life -Notes recent episode after moving from [place] to [place] for her fianc.  Patient states she ended up not getting married and experienced depressive episode. -Not currently on any medications -Was in counseling in the past  Allergies: NKDA  Social history: Patient is single.  She has a bachelor's degree and is currently employed as a Primary school teacher.  Patient endorses social alcohol use.  Patient denies tobacco and drug use.   Health Maintenance: Dental --[facility] --[facility] Immunizations --influenza vaccine 2019, TB test 2016 Colonoscopy --at age 89 Mammogram --unsure Pt is a G3P0 PAP --unsure  Family medical history: Mom-alive Dad-alive, diabetes, MI, HLD, heart disease, HTN, stroke Sister-Alive Sister-Alisha, alive PGM-deceased, cancer, diabetes  Past Medical History:  Diagnosis Date  . Anxiety   . Arthritis   . Blood in stool   . Depression   . Diverticulitis     Past Surgical History:  Procedure Laterality  Date  . FOOT SURGERY    . KIDNEY SURGERY      No current outpatient medications on file prior to visit.   No current facility-administered medications on file prior to visit.     No Known Allergies  History reviewed. No pertinent family history.  Social History   Socioeconomic History  . Marital status: Single    Spouse name: Not on file  . Number of children: Not on file  . Years of education: Not on file  . Highest education level: Not on file  Occupational History  . Not on file  Social Needs  . Financial resource strain: Not on file  . Food insecurity:    Worry: Not on file    Inability: Not on file  . Transportation needs:    Medical: Not on file    Non-medical: Not on file  Tobacco Use  . Smoking status: Never Smoker  . Smokeless tobacco: Never Used  Substance and Sexual Activity  . Alcohol use: No  . Drug use: No  . Sexual activity: Not on file  Lifestyle  . Physical activity:    Days per week: Not on file    Minutes per session: Not on file  . Stress: Not on file  Relationships  . Social connections:    Talks on phone: Not on file    Gets together: Not on file    Attends religious service: Not on file    Active member of club or organization: Not on file    Attends meetings of clubs or organizations: Not on file    Relationship status: Not on file  .  Intimate partner violence:    Fear of current or ex partner: Not on file    Emotionally abused: Not on file    Physically abused: Not on file    Forced sexual activity: Not on file  Other Topics Concern  . Not on file  Social History Narrative  . Not on file    ROS General: Denies chills, night sweats, changes in weight, changes in appetite  + fever HEENT: Denies ear pain, changes in vision  +HA, rhinorrhea, sore throat, sneezing CV: Denies CP, palpitations, SOB, orthopnea Pulm: Denies SOB, wheezing  + cough GI: Denies abdominal pain, nausea, vomiting, diarrhea  + constipation GU: Denies  dysuria, hematuria, frequency, vaginal discharge Msk: Denies muscle cramps  + joint pains Neuro: Denies weakness, numbness, tingling Skin: Denies rashes, bruising Psych: Denies anxiety, hallucinations  +depression  BP 110/80 (BP Location: Left Arm, Patient Position: Sitting, Cuff Size: Large)   Pulse 78   Temp 99.1 F (37.3 C) (Oral)   Ht 5\' 4"  (1.626 m)   Wt 184 lb (83.5 kg)   LMP 05/07/2018 (Exact Date)   SpO2 98%   BMI 31.58 kg/m   Physical Exam Gen. Pleasant, well developed, well-nourished, in NAD HEENT - [place]/AT, PERRL, no scleral icterus, no nasal drainage, pharynx without erythema or exudate, TMs full bilaterally.  Mild cervical lymphadenopathy. Lungs: no use of accessory muscles, CTAB, no wheezes, rales or rhonchi Cardiovascular: RRR, No r/g/m, no peripheral edema Musculoskeletal: No deformities, moves all four extremities, no cyanosis or clubbing, normal tone Neuro:  A&Ox3, CN II-XII intact, normal gait Skin:  Warm, dry, intact, no lesions  No results found for this or any previous visit (from the past 2160 hour(s)).  Assessment/Plan: Viral URI with cough  -supportive care.  Gargle with warm salt water or Chloraseptic spray, okay to use over-the-counter cold medications, Tylenol PRN for any pain, fever, or discomfort -given handout - Plan: benzonatate (TESSALON) 100 MG capsule, POC Influenza A/B  negative  Arthritis -stable -Tylenol prn, heat  History of diverticulosis -stable  Chronic constipation -Discussed increasing p.o. intake of water and fiber -Given handout -We will restart Linzess - Plan: linaclotide (LINZESS) 145 MCG CAPS capsule  History of depression -PHQ 9 score 15 -Gad 7 score 3 -Discussed considering restarting counseling  Encounter to establish care -We reviewed the PMH, PSH, FH, SH, Meds and Allergies. -We provided refills for any medications we will prescribe as needed. -We addressed current concerns per orders and patient  instructions. -We have asked for records for pertinent exams, studies, vaccines and notes from previous providers. -We have advised patient to follow up per instructions below.  F/u prn in the next few months  Grier Mitts, MD

## 2018-05-24 NOTE — Patient Instructions (Signed)
Arthritis Arthritis is a term that is commonly used to refer to joint pain or joint disease. There are more than 100 types of arthritis. What are the causes? The most common cause of this condition is wear and tear of a joint. Other causes include:  Gout.  Inflammation of a joint.  An infection of a joint.  Sprains and other injuries near the joint.  A drug reaction or allergic reaction. In some cases, the cause may not be known. What are the signs or symptoms? The main symptom of this condition is pain in the joint with movement. Other symptoms include:  Redness, swelling, or stiffness at a joint.  Warmth coming from the joint.  Fever.  Overall feeling of illness. How is this diagnosed? This condition may be diagnosed with a physical exam and tests, including:  Blood tests.  Urine tests.  Imaging tests, such as MRI, X-rays, or a CT scan. Sometimes, fluid is removed from a joint for testing. How is this treated? Treatment for this condition may involve:  Treatment of the cause, if it is known.  Rest.  Raising (elevating) the joint.  Applying cold or hot packs to the joint.  Medicines to improve symptoms and reduce inflammation.  Injections of a steroid such as cortisone into the joint to help reduce pain and inflammation. Depending on the cause of your arthritis, you may need to make lifestyle changes to reduce stress on your joint. These changes may include exercising more and losing weight. Follow these instructions at home: Medicines  Take over-the-counter and prescription medicines only as told by your health care provider.  Do not take aspirin to relieve pain if gout is suspected. Activity  Rest your joint if told by your health care provider. Rest is important when your disease is active and your joint feels painful, swollen, or stiff.  Avoid activities that make the pain worse. It is important to balance activity with rest.  Exercise your joint  regularly with range-of-motion exercises as told by your health care provider. Try doing low-impact exercise, such as: ? Swimming. ? Water aerobics. ? Biking. ? Walking. Joint Care   If your joint is swollen, keep it elevated if told by your health care provider.  If your joint feels stiff in the morning, try taking a warm shower.  If directed, apply heat to the joint. If you have diabetes, do not apply heat without permission from your health care provider. ? Put a towel between the joint and the hot pack or heating pad. ? Leave the heat on the area for 20-30 minutes.  If directed, apply ice to the joint: ? Put ice in a plastic bag. ? Place a towel between your skin and the bag. ? Leave the ice on for 20 minutes, 2-3 times per day.  Keep all follow-up visits as told by your health care provider. This is important. Contact a health care provider if:  The pain gets worse.  You have a fever. Get help right away if:  You develop severe joint pain, swelling, or redness.  Many joints become painful and swollen.  You develop severe back pain.  You develop severe weakness in your leg.  You cannot control your bladder or bowels. This information is not intended to replace advice given to you by your health care provider. Make sure you discuss any questions you have with your health care provider. Document Released: 04/10/2004 Document Revised: 08/09/2015 Document Reviewed: 05/29/2014 Elsevier Interactive Patient Education  2019 Elsevier  Inc.  Chronic Constipation  Chronic constipation is a condition in which a person has three or fewer bowel movements a week, for three months or longer. This condition is especially common in older adults. The two main kinds of chronic constipation are secondary constipation and functional constipation. Secondary constipation results from another condition or a treatment. Functional constipation, also called primary or idiopathic constipation, is  divided into three types:  Normal transit constipation. In this type, movement of stool through the colon (stool transit) occurs normally.  Slow transit constipation. In this type, stool moves slowly through the colon.  Outlet constipation or pelvic floor dysfunction. In this type, the nerves and muscles that empty the rectum do not work normally. What are the causes? Causes of secondary constipation may include:  Failing to drink enough fluid, eat enough food or fiber, or get physically active.  Pregnancy.  A tear in the anus (anal fissure).  Blockage in the bowel (bowel obstruction).  Narrowing of the bowel (bowel stricture).  Having a long-term medical condition, such as: ? Diabetes. ? Hypothyroidism. ? Multiple sclerosis. ? Parkinson disease. ? Stroke. ? Spinal cord injury. ? Dementia. ? Colon cancer. ? Inflammatory bowel disease (IBD). ? Iron-deficiency anemia. ? Outward collapse of the rectum (rectal prolapse). ? Hemorrhoids.  Taking certain medicines, including: ? Narcotics. These are a certain type of prescription pain medicine. ? Antacids. ? Iron supplements. ? Water pills (diuretics). ? Certain blood pressure medicines. ? Anti-seizure medicines. ? Antidepressants. ? Medicines for Parkinson disease. The cause of functional constipation is not known, but some conditions are associated with it. These conditions include:  Stress.  Problems in the nerves and muscles that control stool transit.  Weak or impaired pelvic floor muscles. What increases the risk? You may be at higher risk for chronic constipation if you:  Are older than age 15.  Are female.  Live in a long-term care facility.  Do not get much exercise or physical activity (have a sedentary lifestyle).  Do not drink enough fluids.  Do not eat enough food, especially fiber.  Have a long-term disease.  Have a mental health disorder or eating disorder.  Take many medicines. What are  the signs or symptoms? The main symptom of chronic constipation is having three or fewer bowel movements a week for several weeks. Other signs and symptoms may vary from person to person. These include:  Pushing hard (straining) to pass stool.  Painful bowel movements.  Having hard or lumpy stools.  Having lower belly discomfort, such as cramps or bloating.  Being unable to have a bowel movement when you feel the urge.  Feeling like you still need to pass stool after a bowel movement.  Feeling that you have something in your rectum that is blocking or preventing bowel movements.  Seeing blood on the toilet paper or in your stool.  Worsening confusion (in older adults). How is this diagnosed? This condition may be diagnosed based on:  Symptoms and medical history. You will be asked about your symptoms, lifestyle, diet, and any medicines that you are taking.  Physical exam. ? Your belly (abdomen) will be examined. ? A digital rectal exam may be done. For this exam, a health care provider places a lubricated, gloved finger into the rectum.  Other tests to check for any underlying causes of your constipation. These may be ordered if you have bleeding in your rectum, weight loss, or a family history of colon cancer. In these cases, you may have: ?  Imaging studies of the colon. These may include X-ray, ultrasound, or CT scan. ? Blood tests. ? A procedure to examine the inside of your colon (colonoscopy). ? More specialized tests to check:  Whether your anal sphincter works well. This is a ring-shaped muscle that controls the closing of the anus.  How well food moves through your colon. ? Tests to measure the nerve signal in your pelvic floor muscles (electromyography). How is this treated? Treatment for chronic constipation depends on the cause. Most often, treatment starts with:  Being more active and getting regular exercise.  Drinking more fluids.  Adding fiber to your  diet. Sources of fiber include fruits, vegetables, whole grains, and fiber supplements.  Using medicines such as stool softeners or medicines that increase contractions in your digestive system (pro-motility agents).  Training your pelvic muscles with biofeedback.  Surgery, if there is obstruction. Treatment for secondary chronic constipation depends on the underlying condition. You may need to:  Stop or change some medicines if they cause constipation.  Use a fiber supplement (bulk laxative) or stool softener.  Use prescription laxative. This works by [employer] into your colon (osmotic laxative). You may also need to see a specialist who treats conditions of the digestive system (gastroenterologist). Follow these instructions at home:   Take over-the-counter and prescription medicines only as told by your health care provider.  If you are taking a laxative, take it as told by your health care provider.  Eat a balanced diet that includes enough fiber. Ask your health care provider to recommend a diet that is right for you.  Drink clear fluids, especially water. Avoid drinking alcohol, caffeine, and soda.  Drink enough fluid to keep your urine pale yellow.  Get some physical activity every day. Ask your health care provider what physical activities are safe for you.  Get colon cancer screenings as told by your health care provider.  Keep all follow-up visits as told by your health care provider. This is important. Contact a health care provider if:  You are having three or fewer bowel movements a week.  Your stools are hard or lumpy.  You notice blood on the toilet paper or in your stool after you have a bowel movement.  You have unexplained weight loss.  You have rectum (rectal) pain.  You have stool leakage.  You experience nausea or vomiting. Get help right away if:  You have rectal bleeding or you pass blood clots.  You have severe rectal pain.  You have  body tissue that pushes out (protrudes) from your anus.  You have severe pain or bloating (distension) in your abdomen.  You have vomiting that you cannot control. Summary  Chronic constipation is a condition in which a person has three or fewer bowel movements a week, for three months or longer.  You may have a higher risk for this condition if you are an older adult, or if you do not drink enough water or get enough physical activity (are sedentary).  Treatment for this condition depends on the cause. Most treatments for chronic constipation include adding fiber to your diet, drinking more fluids, and getting more physical activity. You may also need to treat any underlying medical conditions or stop or change certain medicines if they cause constipation.  If lifestyle changes do not relieve constipation, your health care provider may recommend taking a laxative. This information is not intended to replace advice given to you by your health care provider. Make sure you discuss  any questions you have with your health care provider. Document Released: 09/30/2016 Document Revised: 11/18/2016 Document Reviewed: 11/18/2016 Elsevier Interactive Patient Education  2019 [employer].  Upper Respiratory Infection, Adult An upper respiratory infection (URI) is a common viral infection of the nose, throat, and upper air passages that lead to the lungs. The most common type of URI is the common cold. URIs usually get better on their own, without medical treatment. What are the causes? A URI is caused by a virus. You may catch a virus by:  Breathing in droplets from an infected person's cough or sneeze.  Touching something that has been exposed to the virus (contaminated) and then touching your mouth, nose, or eyes. What increases the risk? You are more likely to get a URI if:  You are very young or very old.  It is autumn or winter.  You have close contact with others, such as at a daycare,  school, or health care facility.  You smoke.  You have long-term (chronic) heart or lung disease.  You have a weakened disease-fighting (immune) system.  You have nasal allergies or asthma.  You are experiencing a lot of stress.  You work in an area that has poor air circulation.  You have poor nutrition. What are the signs or symptoms? A URI usually involves some of the following symptoms:  Runny or stuffy (congested) nose.  Sneezing.  Cough.  Sore throat.  Headache.  Fatigue.  Fever.  Loss of appetite.  Pain in your forehead, behind your eyes, and over your cheekbones (sinus pain).  Muscle aches.  Redness or irritation of the eyes.  Pressure in the ears or face. How is this diagnosed? This condition may be diagnosed based on your medical history and symptoms, and a physical exam. Your health care provider may use a cotton swab to take a mucus sample from your nose (nasal swab). This sample can be tested to determine what virus is causing the illness. How is this treated? URIs usually get better on their own within 7-10 days. You can take steps at home to relieve your symptoms. Medicines cannot cure URIs, but your health care provider may recommend certain medicines to help relieve symptoms, such as:  Over-the-counter cold medicines.  Cough suppressants. Coughing is a type of defense against infection that helps to clear the respiratory system, so take these medicines only as recommended by your health care provider.  Fever-reducing medicines. Follow these instructions at home: Activity  Rest as needed.  If you have a fever, stay home from work or school until your fever is gone or until your health care provider says you are no longer contagious. Your health care provider may have you wear a face mask to prevent your infection from spreading. Relieving symptoms  Gargle with a salt-water mixture 3-4 times a day or as needed. To make a salt-water mixture,  completely dissolve -1 tsp of salt in 1 cup of warm water.  Use a cool-mist humidifier to add moisture to the air. This can help you breathe more easily. Eating and drinking   Drink enough fluid to keep your urine pale yellow.  Eat soups and other clear broths. General instructions   Take over-the-counter and prescription medicines only as told by your health care provider. These include cold medicines, fever reducers, and cough suppressants.  Do not use any products that contain nicotine or tobacco, such as cigarettes and e-cigarettes. If you need help quitting, ask your health care provider.  Stay  away from secondhand smoke.  Stay up to date on all immunizations, including the yearly (annual) flu vaccine.  Keep all follow-up visits as told by your health care provider. This is important. How to prevent the spread of infection to others   URIs can be passed from person to person (are contagious). To prevent the infection from spreading: ? Wash your hands often with soap and water. If soap and water are not available, use hand sanitizer. ? Avoid touching your mouth, face, eyes, or nose. ? Cough or sneeze into a tissue or your sleeve or elbow instead of into your hand or into the air. Contact a health care provider if:  You are getting worse instead of better.  You have a fever or chills.  Your mucus is brown or red.  You have yellow or brown discharge coming from your nose.  You have pain in your face, especially when you bend forward.  You have swollen neck glands.  You have pain while swallowing.  You have white areas in the back of your throat. Get help right away if:  You have shortness of breath that gets worse.  You have severe or persistent: ? Headache. ? Ear pain. ? Sinus pain. ? Chest pain.  You have chronic lung disease along with any of the following: ? Wheezing. ? Prolonged cough. ? Coughing up blood. ? A change in your usual mucus.  You have  a stiff neck.  You have changes in your: ? Vision. ? Hearing. ? Thinking. ? Mood. Summary  An upper respiratory infection (URI) is a common infection of the nose, throat, and upper air passages that lead to the lungs.  A URI is caused by a virus.  URIs usually get better on their own within 7-10 days.  Medicines cannot cure URIs, but your health care provider may recommend certain medicines to help relieve symptoms. This information is not intended to replace advice given to you by your health care provider. Make sure you discuss any questions you have with your health care provider. Document Released: 08/27/2000 Document Revised: 10/17/2016 Document Reviewed: 10/17/2016 Elsevier Interactive Patient Education  2019 [employer].

## 2018-05-26 ENCOUNTER — Telehealth: Payer: Self-pay | Admitting: *Deleted

## 2018-05-26 ENCOUNTER — Other Ambulatory Visit: Payer: Self-pay | Admitting: Family Medicine

## 2018-05-26 DIAGNOSIS — K5909 Other constipation: Secondary | ICD-10-CM

## 2018-05-26 NOTE — Telephone Encounter (Signed)
Fax received from [facility] stating the request was denied and this was given to Dr Lockie Pares asst.

## 2018-05-26 NOTE — Telephone Encounter (Signed)
Copied from [place] [id number]. Topic: General - Other >> May 26, 2018  3:01 PM Yvette Rack wrote: Reason for CRM: Melanie with [facility] Prior Authorization Dept called to ask if pt failed on polyethyelene glycol or lactulose. Cb# [phone]

## 2018-05-26 NOTE — Telephone Encounter (Signed)
Message sent to Dr Lockie Pares asst.

## 2018-05-26 NOTE — Telephone Encounter (Signed)
Prior auth for Linzess 155mcg sent to [email or web address]-key [id number].

## 2018-05-26 NOTE — Telephone Encounter (Signed)
Rx for Linzess Prior Josem Kaufmann has been denied, please advise

## 2018-05-27 NOTE — Telephone Encounter (Signed)
Please advise 

## 2018-05-27 NOTE — Telephone Encounter (Signed)
Please let pt know.  She can contact her insurance company or try the miralax BID.

## 2018-05-28 NOTE — Telephone Encounter (Signed)
Called pt left a detailed message on pt voicemail with dr Volanda Napoleon advise

## 2018-05-28 NOTE — Telephone Encounter (Signed)
Pt called in and stated that she has contacted her ins and they said they would approve it.  She would like to know if we could send it in again   [place] [phone]  She went though all of the alternative on her previous ins.  She has new ins.

## 2018-05-31 ENCOUNTER — Other Ambulatory Visit: Payer: Self-pay

## 2018-05-31 DIAGNOSIS — K5909 Other constipation: Secondary | ICD-10-CM

## 2018-05-31 MED ORDER — LINACLOTIDE 145 MCG PO CAPS: 145.0000 ug | ORAL_CAPSULE | Freq: Every day | ORAL | 3 refills | Status: DC

## 2018-05-31 NOTE — Telephone Encounter (Signed)
Rx has been faxed back to pt pharmacy

## 2018-06-09 ENCOUNTER — Telehealth: Payer: Self-pay | Admitting: Family Medicine

## 2018-06-09 ENCOUNTER — Telehealth: Payer: Self-pay | Admitting: *Deleted

## 2018-06-09 NOTE — Telephone Encounter (Signed)
Called patient to reschedule appointment and see if she has any issues that need to be addressed by Dr. Volanda Napoleon. Patient did not answer. Left message. Will create a CRM

## 2018-06-09 NOTE — Telephone Encounter (Signed)
I spoke with the patient today to reschedule her physical and she wass coughing really bad. She said that she just saw you and she still has the cough and running nose. She's able to take the musenex but not the cough suppressant.   Please advise

## 2018-06-11 DIAGNOSIS — N760 Acute vaginitis: Secondary | ICD-10-CM | POA: Diagnosis not present

## 2018-06-11 DIAGNOSIS — Z6830 Body mass index (BMI) 30.0-30.9, adult: Secondary | ICD-10-CM | POA: Diagnosis not present

## 2018-06-11 DIAGNOSIS — Z01419 Encounter for gynecological examination (general) (routine) without abnormal findings: Secondary | ICD-10-CM | POA: Diagnosis not present

## 2018-06-15 NOTE — Telephone Encounter (Signed)
Pt is scheduled for a Web Ex appointment scheduled for 06/16/2018 at 8.30 am

## 2018-06-16 ENCOUNTER — Encounter: Payer: Commercial Managed Care - PPO | Admitting: Family Medicine

## 2018-06-16 ENCOUNTER — Encounter: Payer: Self-pay | Admitting: Family Medicine

## 2018-06-16 ENCOUNTER — Telehealth: Payer: Self-pay | Admitting: *Deleted

## 2018-06-16 ENCOUNTER — Other Ambulatory Visit: Payer: Self-pay

## 2018-06-16 ENCOUNTER — Ambulatory Visit (INDEPENDENT_AMBULATORY_CARE_PROVIDER_SITE_OTHER): Payer: Commercial Managed Care - PPO | Admitting: Family Medicine

## 2018-06-16 DIAGNOSIS — R05 Cough: Secondary | ICD-10-CM

## 2018-06-16 DIAGNOSIS — J3489 Other specified disorders of nose and nasal sinuses: Secondary | ICD-10-CM | POA: Diagnosis not present

## 2018-06-16 DIAGNOSIS — R059 Cough, unspecified: Secondary | ICD-10-CM

## 2018-06-16 MED ORDER — BENZONATATE 100 MG PO CAPS: 100.0000 mg | ORAL_CAPSULE | Freq: Two times a day (BID) | ORAL | 0 refills | Status: DC | PRN

## 2018-06-16 NOTE — Telephone Encounter (Signed)
Copied from [place] [id number]. Topic: General - Other >> Jun 16, 2018  9:08 AM Burchel, Sheri [name] wrote: Pt requesting a note listing her diagnosis, and that she does not have COVID-19 for her job.   Pt states it can be emailed to: [email or web address]

## 2018-06-16 NOTE — Progress Notes (Signed)
Virtual Visit via Video Note  I connected with Sheri [name] on 06/16/18 at  8:30 AM EDT by a video enabled telemedicine application and verified that I am speaking with the correct person using two identifiers.  Location patient: work Retail buyer provider:home office Persons participating in the virtual visit: patient, provider  I discussed the limitations of evaluation and management by telemedicine and the availability of in person appointments. The patient expressed understanding and agreed to proceed.   HPI: Pt seen on 3/9 for viral URI given tessalon. Still coughing, but notes improvement.  Still having irritation in her chest, denies wheezing.  Taking mucinex during the day.  Taking tessalon when at home.  Has rhinorrhea but not as bad.  Was unsure if her allergies were starting.  No fever, ear pain or pressure.  Has some throat irritation but attributes it to coughing.  Pt denies changes in appetite.  Working at [employer], states one coworker was sent home this am and another had the flu.     ROS: See pertinent positives and negatives per HPI.  Past Medical History:  Diagnosis Date  . Anxiety   . Arthritis   . Blood in stool   . Depression   . Diverticulitis     Past Surgical History:  Procedure Laterality Date  . FOOT SURGERY    . KIDNEY SURGERY      No family history on file.  SOCIAL HX: unable to work from home, works for [employer].   Current Outpatient Medications:  .  benzonatate (TESSALON) 100 MG capsule, Take 1 capsule (100 mg total) by mouth 2 (two) times daily as needed for cough., Disp: 20 capsule, Rfl: 0 .  linaclotide (LINZESS) 145 MCG CAPS capsule, Take 1 capsule (145 mcg total) by mouth daily before breakfast., Disp: 30 capsule, Rfl: 5 .  linaclotide (LINZESS) 145 MCG CAPS capsule, Take 1 capsule (145 mcg total) by mouth daily before breakfast., Disp: 30 capsule, Rfl: 3  EXAM:  VITALS per patient if applicable:  RR between 12-20 bpm, afebrile.  GENERAL: alert,  oriented, appears well and in no acute distress  HEENT: atraumatic, conjunttiva clear, no obvious abnormalities on inspection of external nose and ears  NECK: normal movements of the head and neck  LUNGS: on inspection no signs of respiratory distress, breathing rate appears normal, no obvious gross SOB, gasping or wheezing  CV: no obvious cyanosis  MS: moves all visible extremities without noticeable abnormality  PSYCH/NEURO: pleasant and cooperative, no obvious depression or anxiety, speech and thought processing grossly intact  ASSESSMENT AND PLAN:  Discussed the following assessment and plan:  Cough -likely 2/2 bronchitis vs post viral cough  -discussed likely course. -ok to use tessalon -tessalon refilled. -discussed supportive care such as drinking warm fluids, honey, OTC cough medication -discussed prednisone for chest tightness.  Will wait at this time. -given precautions for worsening symptoms  Rhinorrhea -possibly 2/2 viral etiology vs allergies. -discussed supportive care -ok to take OTC allergy med such as zyrtec, allegra, or claritin -discussed hand hygiene.     I discussed the assessment and treatment plan with the patient. The patient was provided an opportunity to ask questions and all were answered. The patient agreed with the plan and demonstrated an understanding of the instructions.   The patient was advised to call back or seek an in-person evaluation if the symptoms worsen or if the condition fails to improve as anticipated.   Billie Ruddy, MD

## 2018-06-17 NOTE — Telephone Encounter (Signed)
Spoke with patient. Patient reports she received letter.

## 2018-06-17 NOTE — Telephone Encounter (Signed)
Letter has been sent to pt e mail address as requested

## 2018-06-17 NOTE — Telephone Encounter (Signed)
[name], Sheri 06/16/2018 03:54 PM  Pt called once again regarding a note for her job to inform her employer that she does not have the covid-19 virus. Pt stated she really needs the note e-mailed as soon as possible.

## 2018-07-19 DIAGNOSIS — D250 Submucous leiomyoma of uterus: Secondary | ICD-10-CM | POA: Diagnosis not present

## 2018-07-19 DIAGNOSIS — N920 Excessive and frequent menstruation with regular cycle: Secondary | ICD-10-CM | POA: Diagnosis not present

## 2018-07-19 DIAGNOSIS — D251 Intramural leiomyoma of uterus: Secondary | ICD-10-CM | POA: Diagnosis not present

## 2018-08-02 ENCOUNTER — Telehealth: Payer: Self-pay | Admitting: Family Medicine

## 2018-08-02 DIAGNOSIS — Z1231 Encounter for screening mammogram for malignant neoplasm of breast: Secondary | ICD-10-CM | POA: Diagnosis not present

## 2018-08-02 NOTE — Telephone Encounter (Signed)
Pt with a h/o chronic constipation.  She has tried the med listed.  Was on linzess prior to moving to the state.  Pt can try to contact her insurance company.

## 2018-08-02 NOTE — Telephone Encounter (Signed)
Attempted to do the PA for the Linzess---this has been rejected and they will not cover that medication.  The following are meds that are covered:  Sorbitol, metamucil, lactulose, miralax, MOM or bisacodyl.    Please advise.

## 2018-08-04 NOTE — Telephone Encounter (Signed)
Spoke with pt advised to call her insurance company to see if they will cover linzess or give an alternative, pt verbalized understanding

## 2018-08-16 ENCOUNTER — Telehealth: Payer: Self-pay

## 2018-08-16 NOTE — Telephone Encounter (Signed)
PA for linaclotide (LINZESS) 145 MCG CAPS capsule has been sent to cover my meds.  [employer] - PA Case ID: [id number] - Rx #: [id number]

## 2018-08-18 NOTE — Telephone Encounter (Signed)
PA has been denied. Letter states that additional information will be faxed.

## 2018-09-09 ENCOUNTER — Encounter: Payer: Commercial Managed Care - PPO | Admitting: Family Medicine

## 2018-09-22 ENCOUNTER — Other Ambulatory Visit: Payer: Self-pay | Admitting: Obstetrics and Gynecology

## 2018-10-05 ENCOUNTER — Other Ambulatory Visit (HOSPITAL_COMMUNITY)
Admission: RE | Admit: 2018-10-05 | Discharge: 2018-10-05 | Disposition: A | Discharge status: Home / Self Care | Payer: Commercial Managed Care - PPO | Source: Ambulatory Visit | Attending: Obstetrics and Gynecology | Admitting: Obstetrics and Gynecology

## 2018-10-05 DIAGNOSIS — Z1159 Encounter for screening for other viral diseases: Secondary | ICD-10-CM | POA: Diagnosis present

## 2018-10-05 LAB — SARS CORONAVIRUS 2 (TAT 6-24 HRS): SARS Coronavirus 2: NEGATIVE

## 2018-10-06 ENCOUNTER — Other Ambulatory Visit: Payer: Self-pay

## 2018-10-06 ENCOUNTER — Encounter (HOSPITAL_BASED_OUTPATIENT_CLINIC_OR_DEPARTMENT_OTHER): Payer: Self-pay | Admitting: *Deleted

## 2018-10-06 NOTE — Progress Notes (Signed)
Spoke w/ pt via phone for pre-op interview.  Npo after mn.  Arrive at 0530.  Needs cbc and urine preg.  Pt had covid test done 10-05-2018.

## 2018-10-07 NOTE — Anesthesia Preprocedure Evaluation (Addendum)
Anesthesia Evaluation  Patient identified by MRN, date of birth, ID band Patient awake    Reviewed: Allergy & Precautions, NPO status , Patient's Chart, lab work & pertinent test results  Airway Mallampati: II  TM Distance: >3 FB Neck ROM: Full    Dental no notable dental hx. (+) Dental Advisory Given, Teeth Intact   Pulmonary neg pulmonary ROS,    Pulmonary exam normal breath sounds clear to auscultation       Cardiovascular negative cardio ROS Normal cardiovascular exam Rhythm:Regular Rate:Normal     Neuro/Psych negative neurological ROS  negative psych ROS   GI/Hepatic negative GI ROS, Neg liver ROS,   Endo/Other  negative endocrine ROS  Renal/GU negative Renal ROS     Musculoskeletal  (+) Arthritis ,   Abdominal   Peds  Hematology  (+) anemia ,   Anesthesia Other Findings   Reproductive/Obstetrics negative OB ROS                            Anesthesia Physical Anesthesia Plan  ASA: II  Anesthesia Plan: General   Post-op Pain Management:    Induction: Intravenous  PONV Risk Score and Plan: 4 or greater and Ondansetron, Dexamethasone, Midazolam, Scopolamine patch - Pre-op and Treatment may vary due to age or medical condition  Airway Management Planned: LMA  Additional Equipment:   Intra-op Plan:   Post-operative Plan: Extubation in OR  Informed Consent: I have reviewed the patients History and Physical, chart, labs and discussed the procedure including the risks, benefits and alternatives for the proposed anesthesia with the patient or authorized representative who has indicated his/her understanding and acceptance.     Dental advisory given  Plan Discussed with: CRNA  Anesthesia Plan Comments:         Anesthesia Quick Evaluation

## 2018-10-08 ENCOUNTER — Other Ambulatory Visit: Payer: Self-pay | Admitting: Family Medicine

## 2018-10-08 ENCOUNTER — Ambulatory Visit (HOSPITAL_BASED_OUTPATIENT_CLINIC_OR_DEPARTMENT_OTHER): Payer: Commercial Managed Care - PPO | Admitting: Anesthesiology

## 2018-10-08 ENCOUNTER — Encounter (HOSPITAL_BASED_OUTPATIENT_CLINIC_OR_DEPARTMENT_OTHER): Payer: Self-pay

## 2018-10-08 ENCOUNTER — Encounter (HOSPITAL_BASED_OUTPATIENT_CLINIC_OR_DEPARTMENT_OTHER)
Admission: RE | Disposition: A | Discharge status: Home / Self Care | Payer: Self-pay | Source: Home / Self Care | Attending: Obstetrics and Gynecology

## 2018-10-08 ENCOUNTER — Ambulatory Visit (HOSPITAL_BASED_OUTPATIENT_CLINIC_OR_DEPARTMENT_OTHER)
Admission: RE | Admit: 2018-10-08 | Discharge: 2018-10-08 | Disposition: A | Discharge status: Home / Self Care | Payer: Commercial Managed Care - PPO | Attending: Obstetrics and Gynecology | Admitting: Obstetrics and Gynecology

## 2018-10-08 DIAGNOSIS — D250 Submucous leiomyoma of uterus: Secondary | ICD-10-CM | POA: Diagnosis not present

## 2018-10-08 DIAGNOSIS — R109 Unspecified abdominal pain: Secondary | ICD-10-CM

## 2018-10-08 DIAGNOSIS — N920 Excessive and frequent menstruation with regular cycle: Secondary | ICD-10-CM | POA: Insufficient documentation

## 2018-10-08 DIAGNOSIS — K5790 Diverticulosis of intestine, part unspecified, without perforation or abscess without bleeding: Secondary | ICD-10-CM

## 2018-10-08 HISTORY — DX: Excessive and frequent menstruation with regular cycle: N92.0

## 2018-10-08 HISTORY — PX: DILATATION & CURETTAGE/HYSTEROSCOPY WITH MYOSURE: SHX6511

## 2018-10-08 HISTORY — DX: Other constipation: K59.09

## 2018-10-08 HISTORY — DX: Anemia, unspecified: D64.9

## 2018-10-08 HISTORY — DX: Submucous leiomyoma of uterus: D25.0

## 2018-10-08 HISTORY — DX: Presence of spectacles and contact lenses: Z97.3

## 2018-10-08 HISTORY — DX: Diverticulosis of large intestine without perforation or abscess without bleeding: K57.30

## 2018-10-08 LAB — CBC
HCT: 35.4 % — ABNORMAL LOW (ref 36.0–46.0)
Hemoglobin: 10.8 g/dL — ABNORMAL LOW (ref 12.0–15.0)
MCH: 26.2 pg (ref 26.0–34.0)
MCHC: 30.5 g/dL (ref 30.0–36.0)
MCV: 85.7 fL (ref 80.0–100.0)
Platelets: 203 10*3/uL (ref 150–400)
RBC: 4.13 MIL/uL (ref 3.87–5.11)
RDW: 14.4 % (ref 11.5–15.5)
WBC: 6.9 10*3/uL (ref 4.0–10.5)
nRBC: 0 % (ref 0.0–0.2)

## 2018-10-08 LAB — POCT PREGNANCY, URINE: Preg Test, Ur: NEGATIVE

## 2018-10-08 SURGERY — DILATATION & CURETTAGE/HYSTEROSCOPY WITH MYOSURE
Anesthesia: General | Site: Vagina

## 2018-10-08 MED ORDER — LIDOCAINE 2% (20 MG/ML) 5 ML SYRINGE
INTRAMUSCULAR | Status: DC | PRN
  Administered 2018-10-08: 100 mg via INTRAVENOUS

## 2018-10-08 MED ORDER — FENTANYL CITRATE (PF) 100 MCG/2ML IJ SOLN
INTRAMUSCULAR | Status: AC
  Filled 2018-10-08: qty 2

## 2018-10-08 MED ORDER — PROPOFOL 10 MG/ML IV BOLUS
INTRAVENOUS | Status: DC | PRN
  Administered 2018-10-08: 200 mg via INTRAVENOUS

## 2018-10-08 MED ORDER — ONDANSETRON HCL 4 MG/2ML IJ SOLN
INTRAMUSCULAR | Status: DC | PRN
  Administered 2018-10-08: 4 mg via INTRAVENOUS

## 2018-10-08 MED ORDER — DEXAMETHASONE SODIUM PHOSPHATE 4 MG/ML IJ SOLN
INTRAMUSCULAR | Status: DC | PRN
  Administered 2018-10-08: 10 mg via INTRAVENOUS

## 2018-10-08 MED ORDER — PROPOFOL 10 MG/ML IV BOLUS
INTRAVENOUS | Status: AC
  Filled 2018-10-08: qty 40

## 2018-10-08 MED ORDER — MIDAZOLAM HCL 2 MG/2ML IJ SOLN
INTRAMUSCULAR | Status: AC
  Filled 2018-10-08: qty 2

## 2018-10-08 MED ORDER — LIDOCAINE 2% (20 MG/ML) 5 ML SYRINGE
INTRAMUSCULAR | Status: AC
  Filled 2018-10-08: qty 5

## 2018-10-08 MED ORDER — ACETAMINOPHEN 10 MG/ML IV SOLN
INTRAVENOUS | Status: DC | PRN
  Administered 2018-10-08: 1000 mg via INTRAVENOUS

## 2018-10-08 MED ORDER — ACETAMINOPHEN 10 MG/ML IV SOLN
INTRAVENOUS | Status: AC
  Filled 2018-10-08: qty 100

## 2018-10-08 MED ORDER — PROMETHAZINE HCL 25 MG/ML IJ SOLN
6.2500 mg | INTRAMUSCULAR | Status: DC | PRN
  Filled 2018-10-08: qty 1

## 2018-10-08 MED ORDER — MIDAZOLAM HCL 5 MG/5ML IJ SOLN
INTRAMUSCULAR | Status: DC | PRN
  Administered 2018-10-08: 2 mg via INTRAVENOUS

## 2018-10-08 MED ORDER — DEXAMETHASONE SODIUM PHOSPHATE 10 MG/ML IJ SOLN
INTRAMUSCULAR | Status: AC
  Filled 2018-10-08: qty 1

## 2018-10-08 MED ORDER — FENTANYL CITRATE (PF) 100 MCG/2ML IJ SOLN
25.0000 ug | INTRAMUSCULAR | Status: DC | PRN
  Administered 2018-10-08: 25 ug via INTRAVENOUS
  Filled 2018-10-08: qty 1

## 2018-10-08 MED ORDER — ARTIFICIAL TEARS OPHTHALMIC OINT
TOPICAL_OINTMENT | OPHTHALMIC | Status: AC
  Filled 2018-10-08: qty 3.5

## 2018-10-08 MED ORDER — SCOPOLAMINE 1 MG/3DAYS TD PT72
MEDICATED_PATCH | TRANSDERMAL | Status: DC | PRN
  Administered 2018-10-08: 1 via TRANSDERMAL

## 2018-10-08 MED ORDER — LACTATED RINGERS IV SOLN
INTRAVENOUS | Status: DC
  Administered 2018-10-08: 06:00:00 via INTRAVENOUS
  Filled 2018-10-08: qty 1000

## 2018-10-08 MED ORDER — KETOROLAC TROMETHAMINE 30 MG/ML IJ SOLN
INTRAMUSCULAR | Status: AC
  Filled 2018-10-08: qty 1

## 2018-10-08 MED ORDER — OXYCODONE HCL 5 MG/5ML PO SOLN
5.0000 mg | Freq: Once | ORAL | Status: DC | PRN
  Filled 2018-10-08: qty 5

## 2018-10-08 MED ORDER — OXYCODONE HCL 5 MG PO TABS
5.0000 mg | ORAL_TABLET | Freq: Once | ORAL | Status: DC | PRN
  Filled 2018-10-08: qty 1

## 2018-10-08 MED ORDER — SCOPOLAMINE 1 MG/3DAYS TD PT72
MEDICATED_PATCH | TRANSDERMAL | Status: AC
  Filled 2018-10-08: qty 1

## 2018-10-08 MED ORDER — KETOROLAC TROMETHAMINE 30 MG/ML IJ SOLN
INTRAMUSCULAR | Status: DC | PRN
  Administered 2018-10-08: 30 mg via INTRAVENOUS

## 2018-10-08 MED ORDER — IBUPROFEN 800 MG PO TABS: 800.0000 mg | ORAL_TABLET | Freq: Three times a day (TID) | ORAL | 6 refills | Status: AC | PRN

## 2018-10-08 MED ORDER — ONDANSETRON HCL 4 MG/2ML IJ SOLN
INTRAMUSCULAR | Status: AC
  Filled 2018-10-08: qty 2

## 2018-10-08 MED ORDER — FENTANYL CITRATE (PF) 100 MCG/2ML IJ SOLN
INTRAMUSCULAR | Status: DC | PRN
  Administered 2018-10-08: 50 ug via INTRAVENOUS
  Administered 2018-10-08 (×2): 25 ug via INTRAVENOUS

## 2018-10-08 MED ORDER — MEPERIDINE HCL 25 MG/ML IJ SOLN
6.2500 mg | INTRAMUSCULAR | Status: DC | PRN
  Filled 2018-10-08: qty 1

## 2018-10-08 MED ORDER — SODIUM CHLORIDE 0.9 % IR SOLN
Status: DC | PRN
  Administered 2018-10-08: 6000 mL

## 2018-10-08 SURGICAL SUPPLY — 25 items
BIPOLAR CUTTING LOOP 21FR (ELECTRODE)
CANISTER SUCT 3000ML PPV (MISCELLANEOUS) ×2 IMPLANT
CATH ROBINSON RED A/P 16FR (CATHETERS) ×1 IMPLANT
COVER WAND RF STERILE (DRAPES) ×2 IMPLANT
DEVICE MYOSURE LITE (MISCELLANEOUS) IMPLANT
DEVICE MYOSURE REACH (MISCELLANEOUS) IMPLANT
DILATOR CANAL MILEX (MISCELLANEOUS) IMPLANT
GAUZE 4X4 16PLY RFD (DISPOSABLE) ×2 IMPLANT
GLOVE BIOGEL PI IND STRL 7.0 (GLOVE) ×1 IMPLANT
GLOVE BIOGEL PI INDICATOR 7.0 (GLOVE) ×1
GLOVE ECLIPSE 6.5 STRL STRAW (GLOVE) ×2 IMPLANT
GOWN STRL REUS W/TWL LRG LVL3 (GOWN DISPOSABLE) ×2 IMPLANT
IV NS IRRIG 3000ML ARTHROMATIC (IV SOLUTION) ×2 IMPLANT
KIT PROCEDURE FLUENT (KITS) ×2 IMPLANT
KIT TURNOVER CYSTO (KITS) ×2 IMPLANT
LOOP CUTTING BIPOLAR 21FR (ELECTRODE) IMPLANT
MYOSURE XL FIBROID (MISCELLANEOUS) ×4
PACK VAGINAL MINOR WOMEN LF (CUSTOM PROCEDURE TRAY) ×2 IMPLANT
PAD OB MATERNITY 4.3X12.25 (PERSONAL CARE ITEMS) ×2 IMPLANT
PAD PREP 24X48 CUFFED NSTRL (MISCELLANEOUS) ×2 IMPLANT
SEAL CERVICAL OMNI LOK (ABLATOR) IMPLANT
SEAL ROD LENS SCOPE MYOSURE (ABLATOR) ×2 IMPLANT
SYSTEM TISS REMOVAL MYOSURE XL (MISCELLANEOUS) IMPLANT
TOWEL OR 17X26 10 PK STRL BLUE (TOWEL DISPOSABLE) ×2 IMPLANT
WATER STERILE IRR 500ML POUR (IV SOLUTION) ×1 IMPLANT

## 2018-10-08 NOTE — Discharge Instructions (Signed)
DISCHARGE INSTRUCTIONS: HYSTEROSCOPY  The following instructions have been prepared to help you care for yourself upon your return home.  May Remove Scop patch on or before 10/11/18  May take Ibuprofen after 2:00pm today.    Personal hygiene:  Use sanitary pads for vaginal drainage, not tampons.  Shower the day after your procedure.  NO tub baths, pools or Jacuzzis for 2-3 weeks.  Wipe front to back after using the bathroom.  Activity and limitations:  Do NOT drive or operate any equipment for 24 hours. The effects of anesthesia are still present and drowsiness may result.  Do NOT rest in bed all day.  Walking is encouraged.  Walk up and down stairs slowly.  You may resume your normal activity in one to two days or as indicated by your physician. Sexual activity: NO intercourse for at least 2 weeks after the procedure, or as indicated by your Doctor.  Diet: Eat a light meal as desired this evening. You may resume your usual diet tomorrow.  Return to Work: You may resume your work activities in one to two days or as indicated by Marine scientist.  What to expect after your surgery: Expect to have vaginal bleeding/discharge for 2-3 days and spotting for up to 10 days. It is not unusual to have soreness for up to 1-2 weeks. You may have a slight burning sensation when you urinate for the first day. Mild cramps may continue for a couple of days. You may have a regular period in 2-6 weeks.  Call your doctor for any of the following:  Excessive vaginal bleeding or clotting, saturating and changing one pad every hour.  Inability to urinate 6 hours after discharge from hospital.  Pain not relieved by pain medication.  Fever of 100.4 F or greater.  Unusual vaginal discharge or odor.  Return to office _________________Call for an appointment ___________________ Patients signature: ______________________ Nurses signature ________________________  Post Anesthesia Care  Unit [phone]    Post Anesthesia Home Care Instructions  Activity: Get plenty of rest for the remainder of the day. A responsible individual must stay with you for 24 hours following the procedure.  For the next 24 hours, DO NOT: -Drive a car -Paediatric nurse -Drink alcoholic beverages -Take any medication unless instructed by your physician -Make any legal decisions or sign important papers.  Meals: Start with liquid foods such as gelatin or soup. Progress to regular foods as tolerated. Avoid greasy, spicy, heavy foods. If nausea and/or vomiting occur, drink only clear liquids until the nausea and/or vomiting subsides. Call your physician if vomiting continues.  Special Instructions/Symptoms: Your throat may feel dry or sore from the anesthesia or the breathing tube placed in your throat during surgery. If this causes discomfort, gargle with warm salt water. The discomfort should disappear within 24 hours.  If you had a scopolamine patch placed behind your ear for the management of post- operative nausea and/or vomiting:  1. The medication in the patch is effective for 72 hours, after which it should be removed.  Wrap patch in a tissue and discard in the trash. Wash hands thoroughly with soap and water. 2. You may remove the patch earlier than 72 hours if you experience unpleasant side effects which may include dry mouth, dizziness or visual disturbances. 3. Avoid touching the patch. Wash your hands with soap and water after contact with the patch.

## 2018-10-08 NOTE — Transfer of Care (Signed)
  Last Vitals:  Vitals Value Taken Time  BP 136/82 10/08/18 0821  Temp    Pulse 63 10/08/18 0822  Resp 12 10/08/18 0822  SpO2 100 % 10/08/18 0822  Vitals shown include unvalidated device data.  Last Pain:  Vitals:   10/08/18 0556  TempSrc: Oral  PainSc: 1       Patients Stated Pain Goal: 6 (10/08/18 0556)  Immediate Anesthesia Transfer of Care Note  Patient: Sheri [name]  Procedure(s) Performed: Procedure(s) (LRB): DILATATION & CURETTAGE/HYSTEROSCOPY WITH MYOSURE XL (N/A)  Patient Location: PACU  Anesthesia Type: General  Level of Consciousness: awake, alert  and oriented  Airway & Oxygen Therapy: Patient Spontanous Breathing and Patient connected to nasal cannula oxygen  Post-op Assessment: Report given to PACU RN and Post -op Vital signs reviewed and stable  Post vital signs: Reviewed and stable  Complications: No apparent anesthesia complications

## 2018-10-08 NOTE — Anesthesia Postprocedure Evaluation (Signed)
Anesthesia Post Note  Patient: Insurance account manager  Procedure(s) Performed: DILATATION & CURETTAGE/HYSTEROSCOPY WITH MYOSURE XL (N/A Vagina )     Patient location during evaluation: PACU Anesthesia Type: General Level of consciousness: sedated and patient cooperative Pain management: pain level controlled Vital Signs Assessment: post-procedure vital signs reviewed and stable Respiratory status: spontaneous breathing Cardiovascular status: stable Anesthetic complications: no    Last Vitals:  Vitals:   10/08/18 0915 10/08/18 0956  BP:  139/73  Pulse: 73 65  Resp: 17 16  Temp:  36.6 C  SpO2: 100% 100%    Last Pain:  Vitals:   10/08/18 0956  TempSrc:   PainSc: 2                  Nolon Nations

## 2018-10-08 NOTE — Brief Op Note (Signed)
10/08/2018  8:24 AM  PATIENT:  Sheri [name]  48 y.o. female  PRE-OPERATIVE DIAGNOSIS:  Submucosal Fibroid, Menorrhagia with regular cycle  POST-OPERATIVE DIAGNOSIS:  Submucosal Fibroid, Menorrhagia with regular cycle  PROCEDURE:  Diagnostic hysteroscopy, hysteroscopic resection of SM fibroids, dilation and curettage  SURGEON:  Surgeon(s) and Role:    * Henok Heacock, Alanda Slim, MD - Primary  PHYSICIAN ASSISTANT:   ASSISTANTS: none   ANESTHESIA:   general Findings: large ant intracavitary/SM fibroid, small post SM fibroid, right tubal ostia not seen  Due to fibroid, nl endocervical canal EBL:  30 ml   BLOOD ADMINISTERED:none  DRAINS: none   LOCAL MEDICATIONS USED:  NONE  SPECIMEN:  Source of Specimen:  SM fibroid resection, emc  DISPOSITION OF SPECIMEN:  PATHOLOGY  COUNTS:  YES  TOURNIQUET:  * No tourniquets in log *  DICTATION: .Other Dictation: Dictation Number [id number]  PLAN OF CARE: Discharge to home after PACU  PATIENT DISPOSITION:  PACU - hemodynamically stable.   Delay start of Pharmacological VTE agent (>24hrs) due to surgical blood loss or risk of bleeding: no

## 2018-10-08 NOTE — Anesthesia Procedure Notes (Signed)
Procedure Name: LMA Insertion Date/Time: 10/08/2018 7:32 AM Performed by: Nolon Nations, MD Pre-anesthesia Checklist: Patient identified, Emergency Drugs available, Suction available and Patient being monitored Patient Re-evaluated:Patient Re-evaluated prior to induction Oxygen Delivery Method: Circle system utilized Preoxygenation: Pre-oxygenation with 100% oxygen Induction Type: IV induction Ventilation: Mask ventilation without difficulty LMA: LMA inserted LMA Size: 4.0 Number of attempts: 1 Airway Equipment and Method: Bite block Placement Confirmation: positive ETCO2 Tube secured with: Tape Dental Injury: Teeth and Oropharynx as per pre-operative assessment

## 2018-10-08 NOTE — H&P (Signed)
Sheri [name] is an 48 y.o. female P0 with menorrhagia and SM fibroid on sonohysterogram here for surgical mgmt  Pertinent Gynecological History: Menses: heavy Bleeding: menorrhagia Contraception: none DES exposure: denies Blood transfusions: none Sexually transmitted diseases: no past history Previous GYN Procedures: DNC  Last mammogram: normal Date: 2020 Last pap: abnormal: ASCUS (+) HPV Date:05/2018 OB History: G3, P0   Menstrual History: Menarche age: n/a Patient's last menstrual period was 09/18/2018 (exact date).    Past Medical History:  Diagnosis Date  . Anemia   . Anxiety   . Arthritis   . Chronic constipation   . Depression   . Diverticulosis of colon   . Menorrhagia with regular cycle   . Submucous uterine fibroid   . Wears contact lenses     Past Surgical History:  Procedure Laterality Date  . FOOT SURGERY Bilateral 1990s   bilateral reconstruction for deformity    History reviewed. No pertinent family history.  Social History:  reports that she has never smoked. She has never used smokeless tobacco. She reports current alcohol use. She reports that she does not use drugs.  Allergies: No Known Allergies  Medications Prior to Admission  Medication Sig Dispense Refill Last Dose  . BLACK COHOSH PO Take by mouth daily.   10/06/2018 at Unknown time  . folic acid (FOLVITE) 1 MG tablet Take 1 mg by mouth daily.   09/29/2018  . linaclotide (LINZESS) 145 MCG CAPS capsule Take 1 capsule (145 mcg total) by mouth daily before breakfast. 30 capsule 5 10/06/2018 at Unknown time  . Probiotic Product (PROBIOTIC-10) CHEW Chew by mouth daily.   09/29/2018    Review of Systems  All other systems reviewed and are negative.   Blood pressure 122/82, pulse 66, temperature 98.2 F (36.8 C), temperature source Oral, resp. rate 16, height 5\' 5"  (1.651 m), weight 81.3 kg, last menstrual period 09/18/2018, SpO2 100 %. Physical Exam  Constitutional: She is oriented to  person, place, and time. She appears well-developed and well-nourished.  Eyes: EOM are normal.  Neck: Neck supple.  Cardiovascular: Regular rhythm.  Respiratory: Breath sounds normal.  Genitourinary:    Vagina normal.     Genitourinary Comments: Cervix nl Adnexa nl Uterus enlarged irreg   Musculoskeletal: Normal range of motion.  Neurological: She is alert and oriented to person, place, and time.  Skin: Skin is warm and dry.  Psychiatric: She has a normal mood and affect.    Results for orders placed or performed during the hospital encounter of 10/08/18 (from the past 24 hour(s))  CBC     Status: Abnormal   Collection Time: 10/08/18  5:40 AM  Result Value Ref Range   WBC 6.9 4.0 - 10.5 K/uL   RBC 4.13 3.87 - 5.11 MIL/uL   Hemoglobin 10.8 (L) 12.0 - 15.0 g/dL   HCT 35.4 (L) 36.0 - 46.0 %   MCV 85.7 80.0 - 100.0 fL   MCH 26.2 26.0 - 34.0 pg   MCHC 30.5 30.0 - 36.0 g/dL   RDW 14.4 11.5 - 15.5 %   Platelets 203 150 - 400 K/uL   nRBC 0.0 0.0 - 0.2 %  Pregnancy, urine POC     Status: None   Collection Time: 10/08/18  5:55 AM  Result Value Ref Range   Preg Test, Ur NEGATIVE NEGATIVE    No results found.  Assessment/Plan: Menorrhagia with regular cycles SM fibroid P) dx hysteroscopy, hysteroscopic resection of SM fibroid, D&C.  Risk of surgery reviewed  including infection, bleeding, injury to surrounding organ structures, inability to complete resection in one setting, fluid overload and its mgmt, thermal injury, uterine perforation( 03/998) and its mgmt. All ? answered  Sheri [name] 10/08/2018, 6:13 AM

## 2018-10-08 NOTE — Op Note (Signed)
Sheri [name], BETSILL MEDICAL RECORD PQ:[id number] ACCOUNT [id number] DATE OF BIRTH:03-14-1971 FACILITY: [facility] LOCATION: [facility] PHYSICIAN:Desiraye Rolfson A. Victoriya Pol, MD  OPERATIVE REPORT  DATE OF PROCEDURE:  10/08/2018  PREOPERATIVE DIAGNOSIS:  Menorrhagia with regular cycles, submucosal fibroid.  PROCEDURE:  Diagnostic hysteroscopy, hysteroscopic resection of submucosal fibroid using MyoSure, dilation and curettage.  POSTOPERATIVE DIAGNOSIS:  Menorrhagia with regular cycles, submucosal fibroids.  ANESTHESIA:  General.  SURGEON:  Servando Salina, MD  ASSISTANT:  None.  DESCRIPTION OF PROCEDURE:  Under adequate general anesthesia, the patient was placed in the dorsal lithotomy position.  She was sterilely prepped and draped in the usual fashion.  The bladder was catheterized, a moderate amount of urine.  Examination  under anesthesia revealed an anteverted, bulky uterus.  No adnexal masses could be appreciated.  Bivalve speculum was placed in the vagina.  Single-tooth tenaculum was placed on the anterior lip of the cervix.  The cervix was easily dilated up to #21  [facility] dilator.  A diagnostic hysteroscope was introduced into the uterine cavity.  On entry, a large intracavitary anterior right submucosal fibroid was noted as well as a smaller posterior pea-sized submucosal fibroid.  Using the MyoSure XL resectoscope, the resection of these fibroids was done.  The procedure was completed at the time when the fluid deficit had been approaching the 2300 ml mark.  The procedure was stopped.  The cavity was gently curetted and explored with ring forceps with a small free floating piece of the fibroid removed.  The hysteroscope was then reinserted as there was about 300 mL of fluid on the floor, and the cavity was quickly inspected.  The tubal ostia were not well seen due to the fluid being murky.  Nonetheless, the resection was felt to be completely resected anteriorly and posteriorly, and  therefore all instruments were then removed from the vagina.  SPECIMEN:  Labeled fibroid resection.  The endometrial curettings were sent to pathology.  ESTIMATED BLOOD LOSS:  About 30 mL.  INTRAOPERATIVE FLUIDS:  1 L.  FLUID DEFICIT:  2300 ml.  COMPLICATIONS:  None.  DISPOSITION:  The patient tolerated the procedure well and was transferred to the recovery room in stable condition.  LN/NUANCE  D:10/08/2018 T:10/08/2018 JOB:[id number]

## 2018-10-11 ENCOUNTER — Encounter (HOSPITAL_BASED_OUTPATIENT_CLINIC_OR_DEPARTMENT_OTHER): Payer: Self-pay | Admitting: Obstetrics and Gynecology

## 2018-10-18 ENCOUNTER — Ambulatory Visit
Admission: RE | Admit: 2018-10-18 | Discharge: 2018-10-18 | Disposition: A | Discharge status: Home / Self Care | Payer: Commercial Managed Care - PPO | Source: Ambulatory Visit | Attending: Family Medicine | Admitting: Family Medicine

## 2018-10-18 DIAGNOSIS — K5790 Diverticulosis of intestine, part unspecified, without perforation or abscess without bleeding: Secondary | ICD-10-CM

## 2018-10-18 DIAGNOSIS — R109 Unspecified abdominal pain: Secondary | ICD-10-CM

## 2018-10-18 MED ORDER — IOPAMIDOL (ISOVUE-300) INJECTION 61%
100.0000 mL | Freq: Once | INTRAVENOUS | Status: AC | PRN
  Administered 2018-10-18: 100 mL via INTRAVENOUS

## 2019-03-04 ENCOUNTER — Other Ambulatory Visit: Payer: Self-pay | Admitting: Cardiology

## 2019-03-04 DIAGNOSIS — Z20822 Contact with and (suspected) exposure to covid-19: Secondary | ICD-10-CM

## 2019-03-05 LAB — NOVEL CORONAVIRUS, NAA: SARS-CoV-2, NAA: NOT DETECTED

## 2019-04-21 ENCOUNTER — Other Ambulatory Visit: Payer: Self-pay | Admitting: General Surgery

## 2019-04-21 DIAGNOSIS — N6452 Nipple discharge: Secondary | ICD-10-CM

## 2019-04-22 ENCOUNTER — Other Ambulatory Visit: Payer: Self-pay | Admitting: General Surgery

## 2019-04-22 ENCOUNTER — Other Ambulatory Visit: Payer: Self-pay | Admitting: Obstetrics and Gynecology

## 2019-05-03 ENCOUNTER — Other Ambulatory Visit: Payer: Self-pay | Admitting: General Surgery

## 2019-05-03 ENCOUNTER — Ambulatory Visit
Admission: RE | Admit: 2019-05-03 | Discharge: 2019-05-03 | Disposition: A | Discharge status: Home / Self Care | Payer: Commercial Managed Care - PPO | Source: Ambulatory Visit | Attending: General Surgery | Admitting: General Surgery

## 2019-05-03 ENCOUNTER — Other Ambulatory Visit: Payer: Self-pay

## 2019-05-03 DIAGNOSIS — N6320 Unspecified lump in the left breast, unspecified quadrant: Secondary | ICD-10-CM

## 2019-05-03 DIAGNOSIS — N6452 Nipple discharge: Secondary | ICD-10-CM

## 2019-05-10 ENCOUNTER — Ambulatory Visit
Admission: RE | Admit: 2019-05-10 | Discharge: 2019-05-10 | Disposition: A | Discharge status: Home / Self Care | Payer: Commercial Managed Care - PPO | Source: Ambulatory Visit | Attending: General Surgery | Admitting: General Surgery

## 2019-05-10 ENCOUNTER — Other Ambulatory Visit: Payer: Self-pay

## 2019-05-10 DIAGNOSIS — N6320 Unspecified lump in the left breast, unspecified quadrant: Secondary | ICD-10-CM

## 2019-05-27 ENCOUNTER — Ambulatory Visit: Payer: Self-pay | Admitting: General Surgery

## 2019-05-27 DIAGNOSIS — D242 Benign neoplasm of left breast: Secondary | ICD-10-CM

## 2019-06-10 ENCOUNTER — Other Ambulatory Visit: Payer: Self-pay | Admitting: General Surgery

## 2019-06-10 DIAGNOSIS — D242 Benign neoplasm of left breast: Secondary | ICD-10-CM

## 2019-07-22 ENCOUNTER — Encounter (HOSPITAL_BASED_OUTPATIENT_CLINIC_OR_DEPARTMENT_OTHER): Payer: Self-pay | Admitting: General Surgery

## 2019-07-22 ENCOUNTER — Other Ambulatory Visit: Payer: Self-pay

## 2019-07-25 ENCOUNTER — Other Ambulatory Visit (HOSPITAL_COMMUNITY)
Admission: RE | Admit: 2019-07-25 | Discharge: 2019-07-25 | Disposition: A | Discharge status: Home / Self Care | Payer: Commercial Managed Care - PPO | Source: Ambulatory Visit | Attending: General Surgery | Admitting: General Surgery

## 2019-07-25 DIAGNOSIS — Z20822 Contact with and (suspected) exposure to covid-19: Secondary | ICD-10-CM | POA: Diagnosis not present

## 2019-07-25 DIAGNOSIS — Z01812 Encounter for preprocedural laboratory examination: Secondary | ICD-10-CM | POA: Insufficient documentation

## 2019-07-25 LAB — SARS CORONAVIRUS 2 (TAT 6-24 HRS): SARS Coronavirus 2: NEGATIVE

## 2019-07-25 NOTE — Progress Notes (Signed)

## 2019-07-27 ENCOUNTER — Ambulatory Visit
Admission: RE | Admit: 2019-07-27 | Discharge: 2019-07-27 | Disposition: A | Discharge status: Home / Self Care | Payer: Commercial Managed Care - PPO | Source: Ambulatory Visit | Attending: General Surgery | Admitting: General Surgery

## 2019-07-27 ENCOUNTER — Other Ambulatory Visit: Payer: Self-pay

## 2019-07-27 DIAGNOSIS — D242 Benign neoplasm of left breast: Secondary | ICD-10-CM

## 2019-07-28 ENCOUNTER — Encounter (HOSPITAL_BASED_OUTPATIENT_CLINIC_OR_DEPARTMENT_OTHER): Payer: Self-pay | Admitting: General Surgery

## 2019-07-28 ENCOUNTER — Ambulatory Visit (HOSPITAL_BASED_OUTPATIENT_CLINIC_OR_DEPARTMENT_OTHER)
Admission: RE | Admit: 2019-07-28 | Discharge: 2019-07-28 | Disposition: A | Discharge status: Home / Self Care | Payer: Commercial Managed Care - PPO | Attending: General Surgery | Admitting: General Surgery

## 2019-07-28 ENCOUNTER — Ambulatory Visit (HOSPITAL_BASED_OUTPATIENT_CLINIC_OR_DEPARTMENT_OTHER): Payer: Commercial Managed Care - PPO | Admitting: Anesthesiology

## 2019-07-28 ENCOUNTER — Ambulatory Visit
Admission: RE | Admit: 2019-07-28 | Discharge: 2019-07-28 | Disposition: A | Discharge status: Home / Self Care | Payer: Commercial Managed Care - PPO | Source: Ambulatory Visit | Attending: General Surgery | Admitting: General Surgery

## 2019-07-28 ENCOUNTER — Encounter (HOSPITAL_BASED_OUTPATIENT_CLINIC_OR_DEPARTMENT_OTHER)
Admission: RE | Disposition: A | Discharge status: Home / Self Care | Payer: Self-pay | Source: Home / Self Care | Attending: General Surgery

## 2019-07-28 ENCOUNTER — Other Ambulatory Visit: Payer: Self-pay

## 2019-07-28 DIAGNOSIS — N6489 Other specified disorders of breast: Secondary | ICD-10-CM | POA: Diagnosis not present

## 2019-07-28 DIAGNOSIS — D242 Benign neoplasm of left breast: Secondary | ICD-10-CM | POA: Diagnosis present

## 2019-07-28 DIAGNOSIS — M1990 Unspecified osteoarthritis, unspecified site: Secondary | ICD-10-CM | POA: Insufficient documentation

## 2019-07-28 HISTORY — PX: BREAST LUMPECTOMY WITH RADIOACTIVE SEED LOCALIZATION: SHX6424

## 2019-07-28 LAB — POCT PREGNANCY, URINE: Preg Test, Ur: NEGATIVE

## 2019-07-28 SURGERY — BREAST LUMPECTOMY WITH RADIOACTIVE SEED LOCALIZATION
Anesthesia: General | Site: Breast | Laterality: Left

## 2019-07-28 MED ORDER — ONDANSETRON HCL 4 MG/2ML IJ SOLN
INTRAMUSCULAR | Status: AC
  Filled 2019-07-28: qty 2

## 2019-07-28 MED ORDER — MIDAZOLAM HCL 2 MG/2ML IJ SOLN
INTRAMUSCULAR | Status: AC
  Filled 2019-07-28: qty 2

## 2019-07-28 MED ORDER — GABAPENTIN 300 MG PO CAPS
ORAL_CAPSULE | ORAL | Status: AC
  Filled 2019-07-28: qty 1

## 2019-07-28 MED ORDER — ORAL CARE MOUTH RINSE: 15.0000 mL | Freq: Once | OROMUCOSAL | Status: DC

## 2019-07-28 MED ORDER — ACETAMINOPHEN 500 MG PO TABS
ORAL_TABLET | ORAL | Status: AC
  Filled 2019-07-28: qty 2

## 2019-07-28 MED ORDER — MIDAZOLAM HCL 2 MG/2ML IJ SOLN: 1.0000 mg | INTRAMUSCULAR | Status: DC | PRN

## 2019-07-28 MED ORDER — CHLORHEXIDINE GLUCONATE 0.12 % MT SOLN
15.0000 mL | Freq: Once | OROMUCOSAL | Status: DC
  Filled 2019-07-28: qty 15

## 2019-07-28 MED ORDER — DEXAMETHASONE SODIUM PHOSPHATE 10 MG/ML IJ SOLN
INTRAMUSCULAR | Status: AC
  Filled 2019-07-28: qty 1

## 2019-07-28 MED ORDER — FENTANYL CITRATE (PF) 100 MCG/2ML IJ SOLN: 50.0000 ug | INTRAMUSCULAR | Status: DC | PRN

## 2019-07-28 MED ORDER — LIDOCAINE 2% (20 MG/ML) 5 ML SYRINGE
INTRAMUSCULAR | Status: AC
  Filled 2019-07-28: qty 5

## 2019-07-28 MED ORDER — FENTANYL CITRATE (PF) 100 MCG/2ML IJ SOLN
INTRAMUSCULAR | Status: AC
  Filled 2019-07-28: qty 2

## 2019-07-28 MED ORDER — CEFAZOLIN SODIUM-DEXTROSE 2-4 GM/100ML-% IV SOLN
INTRAVENOUS | Status: AC
  Filled 2019-07-28: qty 100

## 2019-07-28 MED ORDER — ACETAMINOPHEN 500 MG PO TABS
1000.0000 mg | ORAL_TABLET | ORAL | Status: AC
  Administered 2019-07-28: 1000 mg via ORAL

## 2019-07-28 MED ORDER — CEFAZOLIN SODIUM-DEXTROSE 2-4 GM/100ML-% IV SOLN
2.0000 g | INTRAVENOUS | Status: AC
  Administered 2019-07-28: 2 g via INTRAVENOUS

## 2019-07-28 MED ORDER — ONDANSETRON HCL 4 MG/2ML IJ SOLN
INTRAMUSCULAR | Status: DC | PRN
  Administered 2019-07-28: 4 mg via INTRAVENOUS

## 2019-07-28 MED ORDER — MIDAZOLAM HCL 5 MG/5ML IJ SOLN
INTRAMUSCULAR | Status: DC | PRN
  Administered 2019-07-28: .5 mg via INTRAVENOUS
  Administered 2019-07-28: 1.5 mg via INTRAVENOUS

## 2019-07-28 MED ORDER — FENTANYL CITRATE (PF) 100 MCG/2ML IJ SOLN
25.0000 ug | INTRAMUSCULAR | Status: DC | PRN
  Administered 2019-07-28 (×2): 25 ug via INTRAVENOUS

## 2019-07-28 MED ORDER — LIDOCAINE HCL (CARDIAC) PF 100 MG/5ML IV SOSY
PREFILLED_SYRINGE | INTRAVENOUS | Status: DC | PRN
  Administered 2019-07-28: 50 mg via INTRAVENOUS

## 2019-07-28 MED ORDER — CELECOXIB 200 MG PO CAPS
200.0000 mg | ORAL_CAPSULE | ORAL | Status: AC
  Administered 2019-07-28: 200 mg via ORAL

## 2019-07-28 MED ORDER — FENTANYL CITRATE (PF) 100 MCG/2ML IJ SOLN
INTRAMUSCULAR | Status: DC | PRN
  Administered 2019-07-28: 75 ug via INTRAVENOUS
  Administered 2019-07-28: 25 ug via INTRAVENOUS

## 2019-07-28 MED ORDER — DEXAMETHASONE SODIUM PHOSPHATE 4 MG/ML IJ SOLN
INTRAMUSCULAR | Status: DC | PRN
  Administered 2019-07-28: 5 mg via INTRAVENOUS

## 2019-07-28 MED ORDER — PROPOFOL 10 MG/ML IV BOLUS
INTRAVENOUS | Status: AC
  Filled 2019-07-28: qty 20

## 2019-07-28 MED ORDER — BUPIVACAINE HCL (PF) 0.25 % IJ SOLN
INTRAMUSCULAR | Status: DC | PRN
  Administered 2019-07-28: 20 mL

## 2019-07-28 MED ORDER — LACTATED RINGERS IV SOLN: INTRAVENOUS | Status: DC

## 2019-07-28 MED ORDER — PROPOFOL 10 MG/ML IV BOLUS
INTRAVENOUS | Status: DC | PRN
  Administered 2019-07-28: 200 mg via INTRAVENOUS

## 2019-07-28 MED ORDER — CHLORHEXIDINE GLUCONATE CLOTH 2 % EX PADS: 6.0000 | MEDICATED_PAD | Freq: Once | CUTANEOUS | Status: DC

## 2019-07-28 MED ORDER — GABAPENTIN 300 MG PO CAPS
300.0000 mg | ORAL_CAPSULE | ORAL | Status: AC
  Administered 2019-07-28: 300 mg via ORAL

## 2019-07-28 MED ORDER — HYDROCODONE-ACETAMINOPHEN 5-325 MG PO TABS: 1.0000 | ORAL_TABLET | Freq: Four times a day (QID) | ORAL | 0 refills | Status: AC | PRN

## 2019-07-28 MED ORDER — CELECOXIB 200 MG PO CAPS
ORAL_CAPSULE | ORAL | Status: AC
  Filled 2019-07-28: qty 1

## 2019-07-28 SURGICAL SUPPLY — 37 items
BLADE SURG 15 STRL LF DISP TIS (BLADE) ×1 IMPLANT
BLADE SURG 15 STRL SS (BLADE) ×1
CANISTER SUC SOCK COL 7IN (MISCELLANEOUS) ×2 IMPLANT
CANISTER SUCT 1200ML W/VALVE (MISCELLANEOUS) ×2 IMPLANT
CHLORAPREP W/TINT 26 (MISCELLANEOUS) ×2 IMPLANT
COVER BACK TABLE 60X90IN (DRAPES) ×2 IMPLANT
COVER MAYO STAND STRL (DRAPES) ×2 IMPLANT
COVER PROBE W GEL 5X96 (DRAPES) ×2 IMPLANT
DERMABOND ADVANCED (GAUZE/BANDAGES/DRESSINGS) ×1
DERMABOND ADVANCED .7 DNX12 (GAUZE/BANDAGES/DRESSINGS) ×1 IMPLANT
DRAPE LAPAROSCOPIC ABDOMINAL (DRAPES) ×2 IMPLANT
DRAPE UTILITY XL STRL (DRAPES) ×2 IMPLANT
ELECT COATED BLADE 2.86 ST (ELECTRODE) ×2 IMPLANT
ELECT REM PT RETURN 9FT ADLT (ELECTROSURGICAL) ×2
ELECTRODE REM PT RTRN 9FT ADLT (ELECTROSURGICAL) ×1 IMPLANT
GLOVE BIO SURGEON STRL SZ7.5 (GLOVE) ×4 IMPLANT
GLOVE BIOGEL PI IND STRL 7.0 (GLOVE) IMPLANT
GLOVE BIOGEL PI INDICATOR 7.0 (GLOVE) ×1
GLOVE SURG SS PI 6.5 STRL IVOR (GLOVE) ×1 IMPLANT
GOWN STRL REUS W/ TWL LRG LVL3 (GOWN DISPOSABLE) ×2 IMPLANT
GOWN STRL REUS W/TWL LRG LVL3 (GOWN DISPOSABLE) ×2
KIT MARKER MARGIN INK (KITS) ×2 IMPLANT
NDL HYPO 25X1 1.5 SAFETY (NEEDLE) IMPLANT
NEEDLE HYPO 25X1 1.5 SAFETY (NEEDLE) ×2 IMPLANT
NS IRRIG 1000ML POUR BTL (IV SOLUTION) ×1 IMPLANT
PENCIL SMOKE EVACUATOR (MISCELLANEOUS) ×2 IMPLANT
SET BASIN DAY SURGERY F.S. (CUSTOM PROCEDURE TRAY) ×2 IMPLANT
SLEEVE SCD COMPRESS KNEE MED (MISCELLANEOUS) ×2 IMPLANT
SPONGE LAP 18X18 RF (DISPOSABLE) ×2 IMPLANT
SUT MON AB 4-0 PC3 18 (SUTURE) ×2 IMPLANT
SUT SILK 2 0 SH (SUTURE) IMPLANT
SUT VICRYL 3-0 CR8 SH (SUTURE) ×2 IMPLANT
SYR CONTROL 10ML LL (SYRINGE) ×1 IMPLANT
TOWEL GREEN STERILE FF (TOWEL DISPOSABLE) ×2 IMPLANT
TRAY FAXITRON CT DISP (TRAY / TRAY PROCEDURE) ×2 IMPLANT
TUBE CONNECTING 20X1/4 (TUBING) ×1 IMPLANT
YANKAUER SUCT BULB TIP NO VENT (SUCTIONS) IMPLANT

## 2019-07-28 NOTE — Transfer of Care (Signed)
Immediate Anesthesia Transfer of Care Note  Patient: Sheri [name]  Procedure(s) Performed: LEFT BREAST LUMPECTOMY WITH RADIOACTIVE SEED LOCALIZATION (Left Breast)  Patient Location: PACU  Anesthesia Type:General  Level of Consciousness: awake and alert   Airway & Oxygen Therapy: Patient Spontanous Breathing and Patient connected to face mask oxygen  Post-op Assessment: Report given to RN and Post -op Vital signs reviewed and stable  Post vital signs: Reviewed and stable  Last Vitals:  Vitals Value Taken Time  BP 126/69 07/28/19 1042  Temp    Pulse 64 07/28/19 1043  Resp 10 07/28/19 1043  SpO2 100 % 07/28/19 1043  Vitals shown include unvalidated device data.  Last Pain:  Vitals:   07/28/19 0858  TempSrc: Tympanic  PainSc: 0-No pain         Complications: No apparent anesthesia complications

## 2019-07-28 NOTE — Discharge Instructions (Signed)

## 2019-07-28 NOTE — Anesthesia Postprocedure Evaluation (Signed)
Anesthesia Post Note  Patient: Insurance account manager  Procedure(s) Performed: LEFT BREAST LUMPECTOMY WITH RADIOACTIVE SEED LOCALIZATION (Left Breast)     Patient location during evaluation: PACU Anesthesia Type: General Level of consciousness: awake and alert Pain management: pain level controlled Vital Signs Assessment: post-procedure vital signs reviewed and stable Respiratory status: spontaneous breathing, nonlabored ventilation, respiratory function stable and patient connected to nasal cannula oxygen Cardiovascular status: blood pressure returned to baseline and stable Postop Assessment: no apparent nausea or vomiting Anesthetic complications: no    Last Vitals:  Vitals:   07/28/19 1130 07/28/19 1200  BP:  (!) 167/88  Pulse: (!) 57 (!) 56  Resp: 12 16  Temp:  36.8 C  SpO2: 100% 100%    Last Pain:  Vitals:   07/28/19 1200  TempSrc:   PainSc: 1                  Sparrow Sanzo L Nahzir Pohle

## 2019-07-28 NOTE — Anesthesia Preprocedure Evaluation (Addendum)
Anesthesia Evaluation  Patient identified by MRN, date of birth, ID band Patient awake    Reviewed: Allergy & Precautions, NPO status , Patient's Chart, lab work & pertinent test results  Airway Mallampati: II  TM Distance: >3 FB Neck ROM: Full    Dental no notable dental hx. (+) Teeth Intact, Dental Advisory Given   Pulmonary neg pulmonary ROS,    Pulmonary exam normal breath sounds clear to auscultation       Cardiovascular negative cardio ROS Normal cardiovascular exam Rhythm:Regular Rate:Normal     Neuro/Psych PSYCHIATRIC DISORDERS Anxiety Depression negative neurological ROS     GI/Hepatic negative GI ROS, Neg liver ROS,   Endo/Other  negative endocrine ROS  Renal/GU negative Renal ROS  negative genitourinary   Musculoskeletal  (+) Arthritis ,   Abdominal   Peds  Hematology negative hematology ROS (+)   Anesthesia Other Findings Left breast papilloma  Reproductive/Obstetrics                            Anesthesia Physical Anesthesia Plan  ASA: II  Anesthesia Plan: General   Post-op Pain Management:    Induction: Intravenous  PONV Risk Score and Plan: 3 and Ondansetron, Dexamethasone and Midazolam  Airway Management Planned: LMA  Additional Equipment:   Intra-op Plan:   Post-operative Plan: Extubation in OR  Informed Consent: I have reviewed the patients History and Physical, chart, labs and discussed the procedure including the risks, benefits and alternatives for the proposed anesthesia with the patient or authorized representative who has indicated his/her understanding and acceptance.     Dental advisory given  Plan Discussed with: CRNA  Anesthesia Plan Comments:         Anesthesia Quick Evaluation

## 2019-07-28 NOTE — H&P (Signed)
Sheri [name]  Location: [facility] Patient #: [id number] DOB: March 21, 1970 Single / Language: Cleophus Molt / Race: Black or African American Female   History of Present Illness The patient is a 49 year old female who presents with a complaint of Breast problems. We are asked to see the patient in consultation by Dr. Lucianne Lei to evaluate her for mastitis of the left breast. The patient is a 49 year old white female who presents with a orange cheesy-like discharge from the left breast. She states that this discharge occurs every day. She denies any pain or fever associated with it. She states that it started in October. She has not had any imaging since it began. She does not smoke. She is otherwise in good health.   Past Surgical History  No pertinent past surgical history   Diagnostic Studies History Colonoscopy  5-10 years ago Mammogram  within last year Pap Smear  1-5 years ago  Allergies  No Known Allergies    Medication History  Linzess (290MCG Capsule, Oral) Active. Fusion Plus (Oral) Active. Medications Reconciled  Social History  Alcohol use  Occasional alcohol use. Caffeine use  Carbonated beverages, Coffee, Tea. No drug use  Tobacco use  Never smoker.  Family History  Alcohol Abuse  Father. Heart Disease  Father, Mother. Heart disease in female family member before age 108  Heart disease in female family member before age 65  Hypertension  Father, Sister.  Pregnancy / Birth History  Age at menarche  46 years. Contraceptive History  Intrauterine device, Oral contraceptives. Gravida  2 Irregular periods  Maternal age  61-20 Para  0  Other Problems  Arthritis  Depression  Diverticulosis  Hemorrhoids     Review of Systems  General Present- Night Sweats and Weight Gain. Not Present- Appetite Loss, Chills, Fatigue, Fever and Weight Loss. Skin Not Present- Change in Wart/Mole, Dryness, Hives, Jaundice, New Lesions,  Non-Healing Wounds, Rash and Ulcer. HEENT Present- Seasonal Allergies and Wears glasses/contact lenses. Not Present- Earache, Hearing Loss, Hoarseness, Nose Bleed, Oral Ulcers, Ringing in the Ears, Sinus Pain, Sore Throat, Visual Disturbances and Yellow Eyes. Respiratory Not Present- Bloody sputum, Chronic Cough, Difficulty Breathing, Snoring and Wheezing. Breast Present- Nipple Discharge. Not Present- Breast Mass, Breast Pain and Skin Changes. Cardiovascular Not Present- Chest Pain, Difficulty Breathing Lying Down, Leg Cramps, Palpitations, Rapid Heart Rate, Shortness of Breath and Swelling of Extremities. Gastrointestinal Present- Bloating. Not Present- Abdominal Pain, Bloody Stool, Change in Bowel Habits, Chronic diarrhea, Constipation, Difficulty Swallowing, Excessive gas, Gets full quickly at meals, Hemorrhoids, Indigestion, Nausea, Rectal Pain and Vomiting. Female Genitourinary Not Present- Frequency, Nocturia, Painful Urination, Pelvic Pain and Urgency. Musculoskeletal Not Present- Back Pain, Joint Pain, Joint Stiffness, Muscle Pain, Muscle Weakness and Swelling of Extremities. Neurological Not Present- Decreased Memory, Fainting, Headaches, Numbness, Seizures, Tingling, Tremor, Trouble walking and Weakness. Psychiatric Not Present- Anxiety, Bipolar, Change in Sleep Pattern, Depression, Fearful and Frequent crying. Hematology Present- Easy Bruising. Not Present- Blood Thinners, Excessive bleeding, Gland problems, HIV and Persistent Infections.  Vitals Weight: 191 lb Height: 65in Body Surface Area: 1.94 m Body Mass Index: 31.78 kg/m  Temp.: 98.72F  Pulse: 81 (Regular)  BP: 132/58(Sitting, Left Arm, Standard)       Physical Exam  General Mental Status-Alert. General Appearance-Consistent with stated age. Hydration-Well hydrated. Voice-Normal.  Head and Neck Head-normocephalic, atraumatic with no lesions or palpable  masses. Trachea-midline. Thyroid Gland Characteristics - normal size and consistency.  Eye Eyeball - Bilateral-Extraocular movements intact. Sclera/Conjunctiva - Bilateral-No scleral icterus.  Chest and Lung Exam Chest and lung exam reveals -quiet, even and easy respiratory effort with no use of accessory muscles and on auscultation, normal breath sounds, no adventitious sounds and normal vocal resonance. Inspection Chest Wall - Normal. Back - normal.  Breast Note: There is no palpable mass in either breast. There is no palpable axillary, supraclavicular, or cervical lymphadenopathy. There is discharge from a duct that appears to be in the lower outer quadrant of the nipple with compression of the breast. The discharge appears to be clear and colorless   Cardiovascular Cardiovascular examination reveals -normal heart sounds, regular rate and rhythm with no murmurs and normal pedal pulses bilaterally.  Abdomen Inspection Inspection of the abdomen reveals - No Hernias. Skin - Scar - no surgical scars. Palpation/Percussion Palpation and Percussion of the abdomen reveal - Soft, Non Tender, No Rebound tenderness, No Rigidity (guarding) and No hepatosplenomegaly. Auscultation Auscultation of the abdomen reveals - Bowel sounds normal.  Neurologic Neurologic evaluation reveals -alert and oriented x 3 with no impairment of recent or remote memory. Mental Status-Normal.  Musculoskeletal Normal Exam - Left-Upper Extremity Strength Normal and Lower Extremity Strength Normal. Normal Exam - Right-Upper Extremity Strength Normal and Lower Extremity Strength Normal.  Lymphatic Head & Neck  General Head & Neck Lymphatics: Bilateral - Description - Normal. Axillary  General Axillary Region: Bilateral - Description - Normal. Tenderness - Non Tender. Femoral & Inguinal  Generalized Femoral & Inguinal Lymphatics: Bilateral - Description - Normal. Tenderness - Non  Tender.    Assessment & Plan DISCHARGE FROM LEFT NIPPLE [phone]) Impression: The patient appears to be having daily discharge from the left nipple. Clinically her exam is unremarkable other than the discharge. At this point I would recommend reevaluating her with mammogram and ultrasound. If this is positive then it may need a needle biopsy. If this is negative then we would recommend moving on to an MRI study. If the MRI is also negative then at that point the only way to try to get rid of the discharge would potentially be a subareolar duct excision. I have discussed with her in detail the risks and benefits of that operation as well as some of the technical aspects and she understands. We will call her with the results of the mammogram and ultrasound and then proceed accordingly. This patient encounter took 30 minutes today to perform the following: take history, perform exam, review outside records, interpret imaging, counsel the patient on their diagnosis and document encounter, findings & plan in the EHR Current Plans Follow Up - Call [facility] office after tests / studies doneto discuss further plans DIAG DIGITAL MAMMOGRAM BILAT (77066)(ACR 0 )(DSN [id number])(G-Code G1004(MG)) (Clinical Scenarios: No content available for this procedure) US BREAST LEFT LIMITED (76642)(ACR 0 )(DSN [id number])(G-Code G1004(MG)) (Clinical Scenarios: No content available for this procedure)

## 2019-07-28 NOTE — Interval H&P Note (Signed)
History and Physical Interval Note:  07/28/2019 9:28 AM  Sheri [name]  has presented today for surgery, with the diagnosis of LEFT BREAST PAPILLOMA.  The various methods of treatment have been discussed with the patient and family. After consideration of risks, benefits and other options for treatment, the patient has consented to  Procedure(s): LEFT BREAST LUMPECTOMY WITH RADIOACTIVE SEED LOCALIZATION (Left) as a surgical intervention.  The patient's history has been reviewed, patient examined, no change in status, stable for surgery.  I have reviewed the patient's chart and labs.  Questions were answered to the patient's satisfaction.     [name]

## 2019-07-28 NOTE — Op Note (Signed)
07/28/2019  10:37 AM  PATIENT:  Sheri [name]  49 y.o. female  PRE-OPERATIVE DIAGNOSIS:  LEFT BREAST PAPILLOMA  POST-OPERATIVE DIAGNOSIS:  LEFT BREAST PAPILLOMA  PROCEDURE:  Procedure(s): LEFT BREAST LUMPECTOMY WITH RADIOACTIVE SEED LOCALIZATION (Left)  SURGEON:  Surgeon(s) and Role:    * Jovita Kussmaul, MD - Primary  PHYSICIAN ASSISTANT:   ASSISTANTS: none   ANESTHESIA:   local and general  EBL:  10 mL   BLOOD ADMINISTERED:none  DRAINS: none   LOCAL MEDICATIONS USED:  MARCAINE     SPECIMEN:  Source of Specimen:  left breast tissue  DISPOSITION OF SPECIMEN:  PATHOLOGY  COUNTS:  YES  TOURNIQUET:  * No tourniquets in log *  DICTATION: .Dragon Dictation   After informed consent was obtained the patient was brought to the operating room and placed in the supine position on the operating table.  After adequate induction of general anesthesia the patient's left breast was prepped with ChloraPrep, allowed to dry, and draped in usual sterile manner.  An appropriate timeout was performed.  Previously an I-125 seed was placed in the outer subareolar left breast to mark an area of intraductal papilloma.  The neoprobe was set to I-125 in the area of radioactivity was readily identified.  The area around this was infiltrated with quarter percent Marcaine.  A curvilinear incision was then made with a 15 blade knife along the lower outer aspect of the areola of the left breast.  The incision was carried through the skin and subcutaneous tissue sharply with the electrocautery.  Dissection was carried towards the radioactive seed under the direction of the neoprobe.  Once I more closely approached the radioactive seed I then removed a circular portion of breast tissue sharply with the electrocautery around the radioactive seed while checking the area of radioactivity frequently.  Once the specimen was removed it was oriented with the appropriate paint colors.  A specimen radiograph was  obtained that showed the clip and seed to be within the specimen.  The specimen was then sent to pathology for further evaluation.  Hemostasis was achieved using the Bovie electrocautery.  The wound was irrigated with saline and infiltrated with more quarter percent Marcaine.  The deep layer of the wound was then closed with layers of interrupted 3-0 Vicryl stitches.  The skin was closed with interrupted 4-0 Monocryl subcuticular stitches.  Dermabond dressings were applied.  The patient tolerated the procedure well.  At the end of the case all needle sponge and instrument counts were correct.  The patient was then awakened and taken to recovery in stable condition.  PLAN OF CARE: Discharge to home after PACU  PATIENT DISPOSITION:  PACU - hemodynamically stable.   Delay start of Pharmacological VTE agent (>24hrs) due to surgical blood loss or risk of bleeding: not applicable

## 2019-07-28 NOTE — Anesthesia Procedure Notes (Signed)
Procedure Name: LMA Insertion Performed by: Tawni Millers, CRNA LMA: LMA inserted LMA Size: 4.0 Tube secured with: Tape

## 2019-07-29 ENCOUNTER — Encounter: Payer: Self-pay | Admitting: *Deleted

## 2019-07-29 LAB — SURGICAL PATHOLOGY

## 2020-08-17 ENCOUNTER — Other Ambulatory Visit: Payer: Self-pay | Admitting: Family Medicine

## 2020-08-17 DIAGNOSIS — N6452 Nipple discharge: Secondary | ICD-10-CM

## 2020-09-07 ENCOUNTER — Other Ambulatory Visit: Payer: Commercial Managed Care - PPO

## 2020-09-22 ENCOUNTER — Encounter (HOSPITAL_COMMUNITY): Payer: Self-pay

## 2020-10-25 ENCOUNTER — Other Ambulatory Visit: Payer: Commercial Managed Care - PPO

## 2020-12-07 ENCOUNTER — Other Ambulatory Visit: Payer: Commercial Managed Care - PPO

## 2021-01-11 ENCOUNTER — Other Ambulatory Visit: Payer: Self-pay | Admitting: Family Medicine

## 2021-01-11 ENCOUNTER — Other Ambulatory Visit: Payer: Self-pay

## 2021-01-11 ENCOUNTER — Ambulatory Visit
Admission: RE | Admit: 2021-01-11 | Discharge: 2021-01-11 | Disposition: A | Discharge status: Home / Self Care | Payer: Commercial Managed Care - PPO | Source: Ambulatory Visit | Attending: Family Medicine | Admitting: Family Medicine

## 2021-01-11 DIAGNOSIS — N6452 Nipple discharge: Secondary | ICD-10-CM

## 2021-01-25 ENCOUNTER — Ambulatory Visit
Admission: RE | Admit: 2021-01-25 | Discharge: 2021-01-25 | Disposition: A | Discharge status: Home / Self Care | Payer: Commercial Managed Care - PPO | Source: Ambulatory Visit | Attending: Family Medicine | Admitting: Family Medicine

## 2021-01-25 ENCOUNTER — Other Ambulatory Visit: Payer: Self-pay

## 2021-01-25 DIAGNOSIS — N6452 Nipple discharge: Secondary | ICD-10-CM

## 2022-02-26 IMAGING — MG DIGITAL DIAGNOSTIC BILAT W/ TOMO W/ CAD
6 of 10 series · 6 of 30 positions shown · non-contrast
Comparison: Previous exam(s).

CLINICAL DATA: 50-year-old female with history of left breast
excision for papilloma presents with recurrent, spontaneous
clear/yellow left nipple discharge.

EXAM:
DIGITAL DIAGNOSTIC BILATERAL MAMMOGRAM WITH TOMOSYNTHESIS AND CAD;
ULTRASOUND LEFT BREAST LIMITED
TECHNIQUE: Bilateral digital diagnostic mammography and breast tomosynthesis
was performed. The images were evaluated with computer-aided
detection.; Targeted ultrasound examination of the left breast was
performed.

[L CC synth-2D]
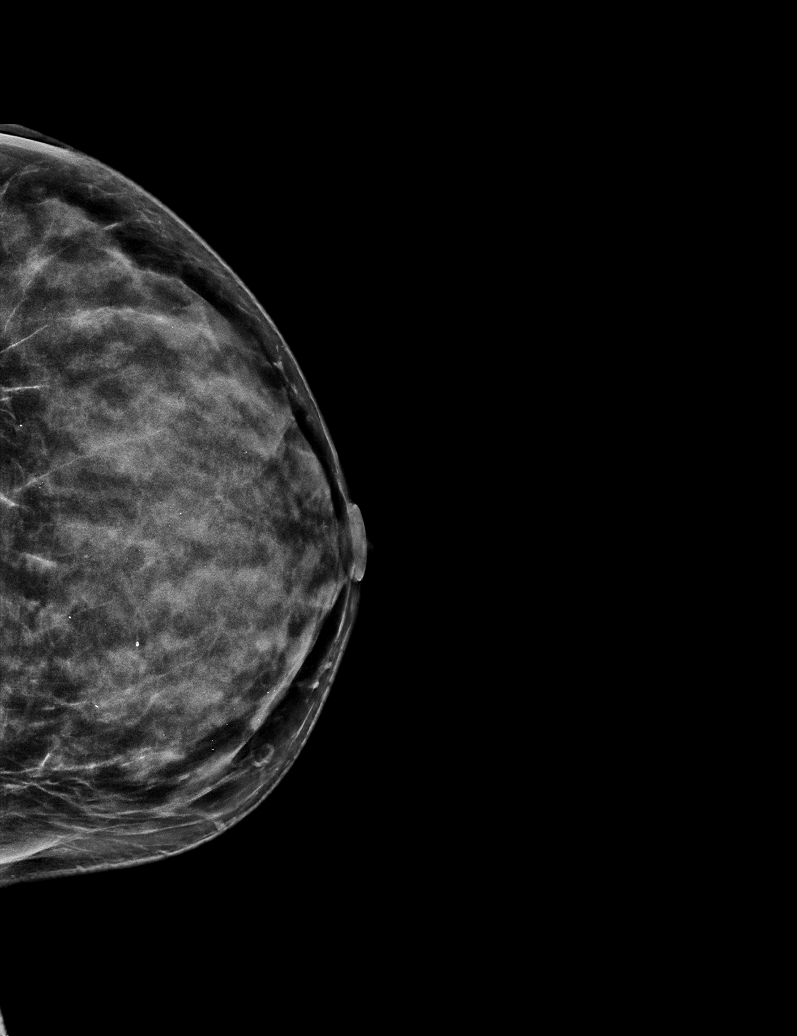

[L MLO synth-2D (1 of 2)]
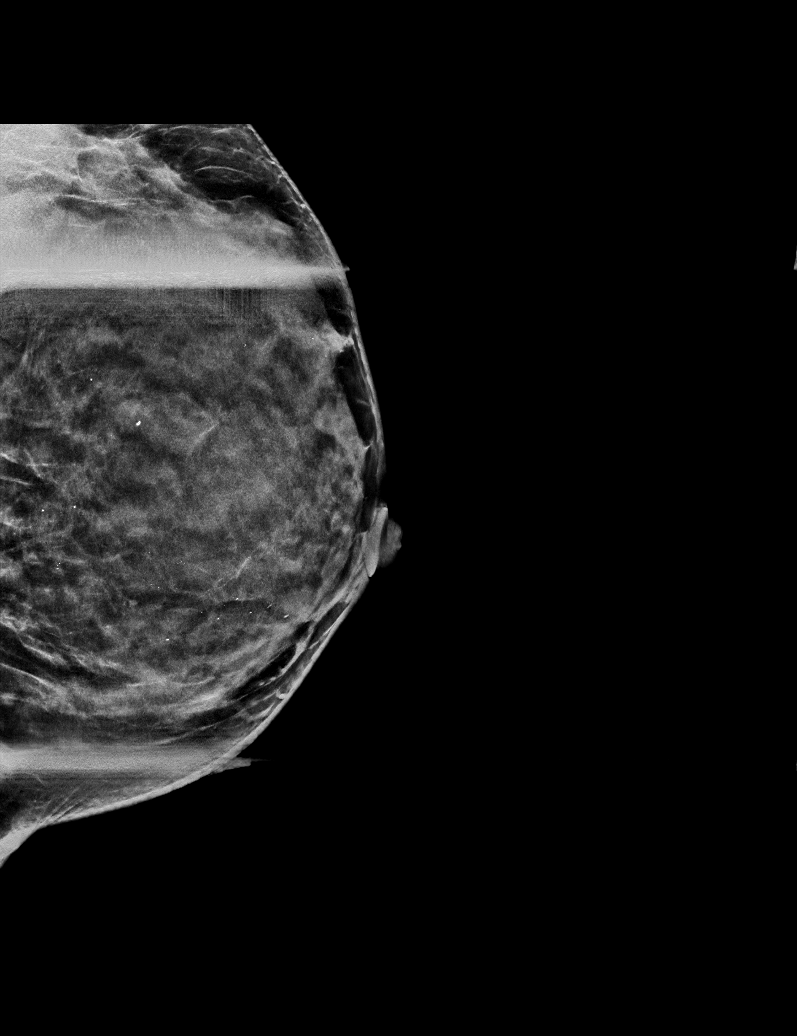

[R MLO synth-2D]
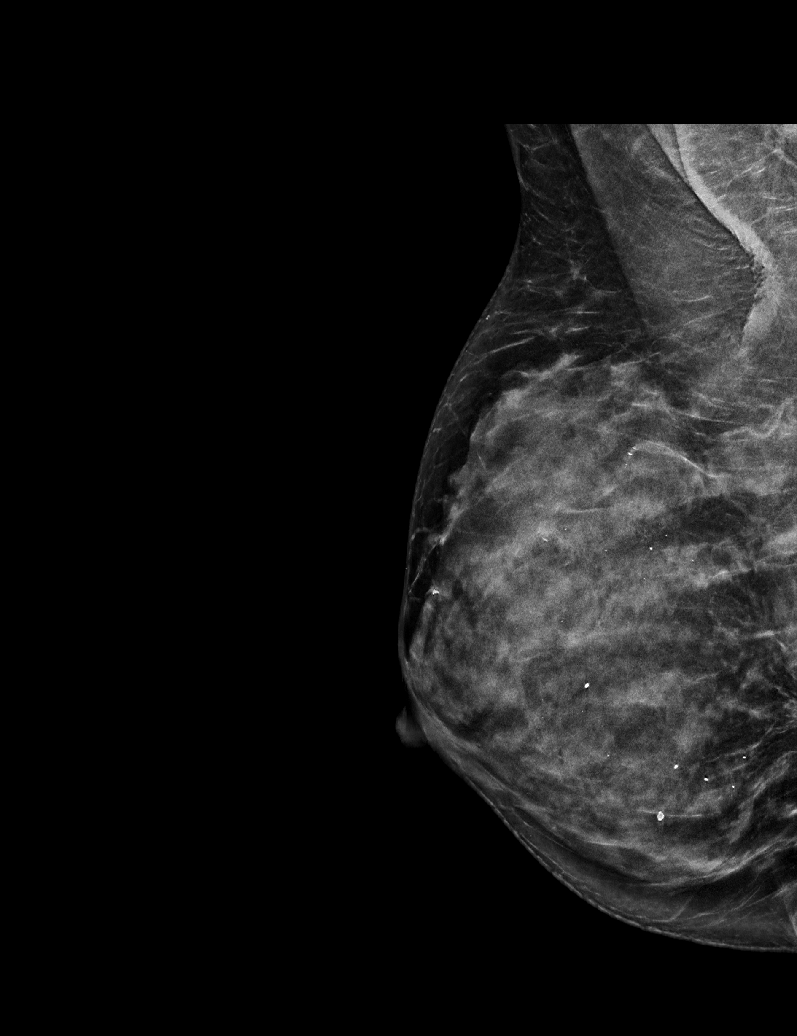

[L MLO synth-2D (2 of 2)]
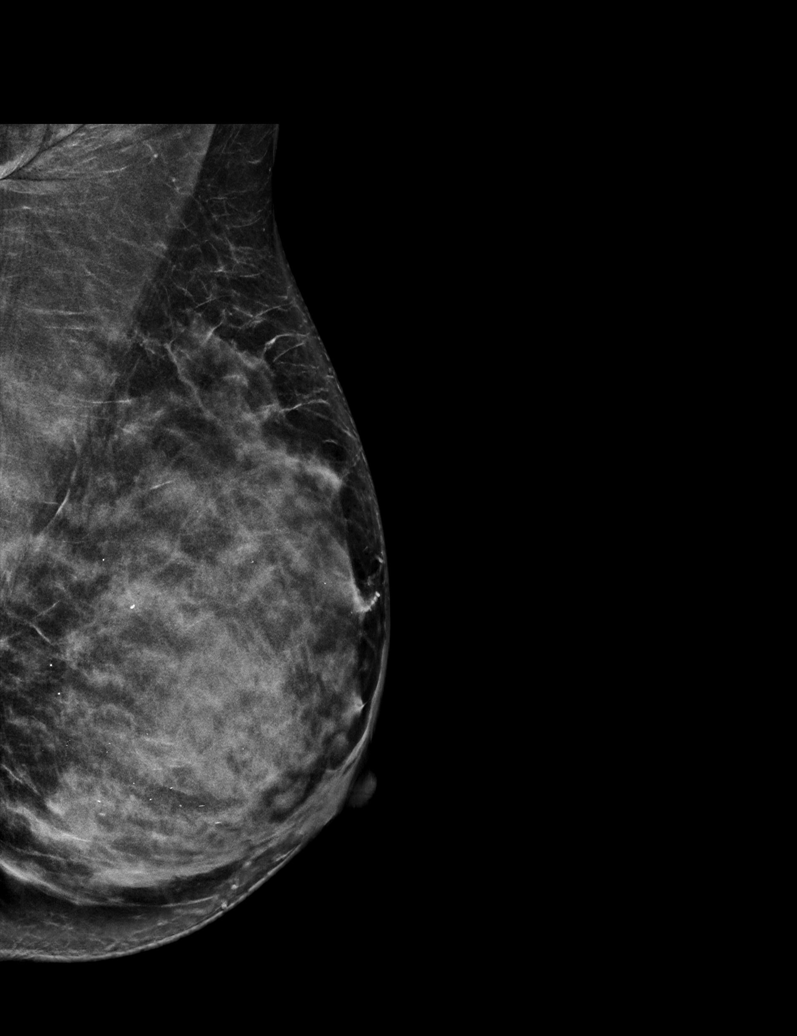

[R CC synth-2D]
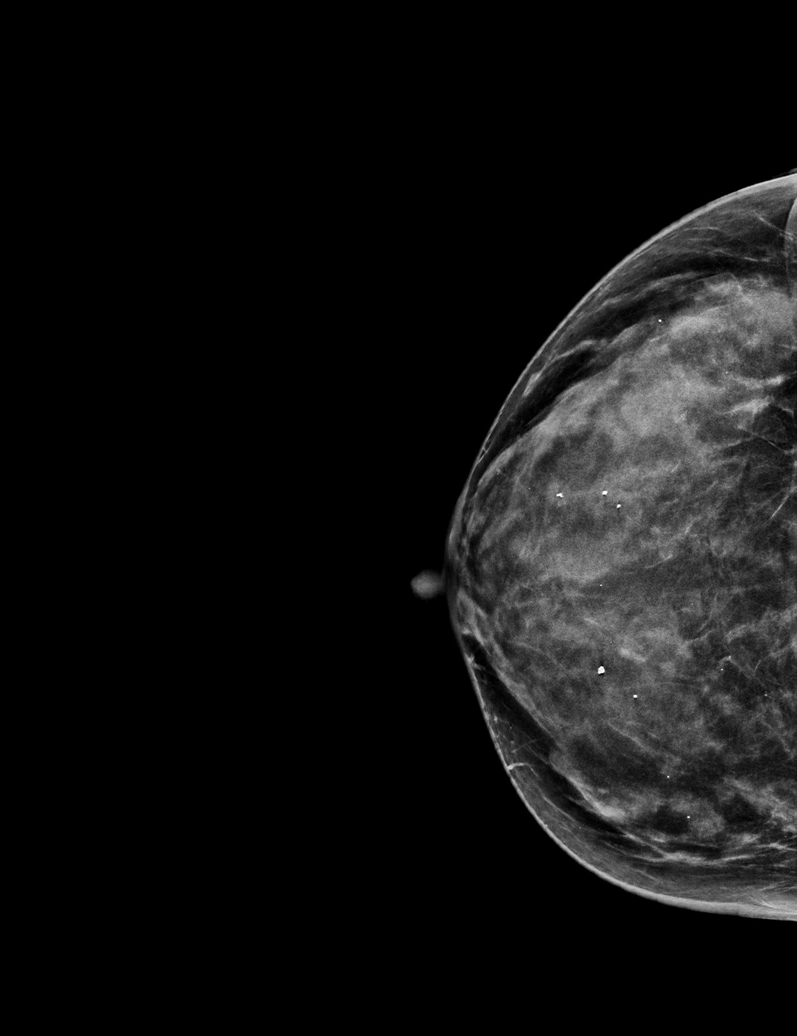

[L MLO tomo · tomo slice 27/52.0]
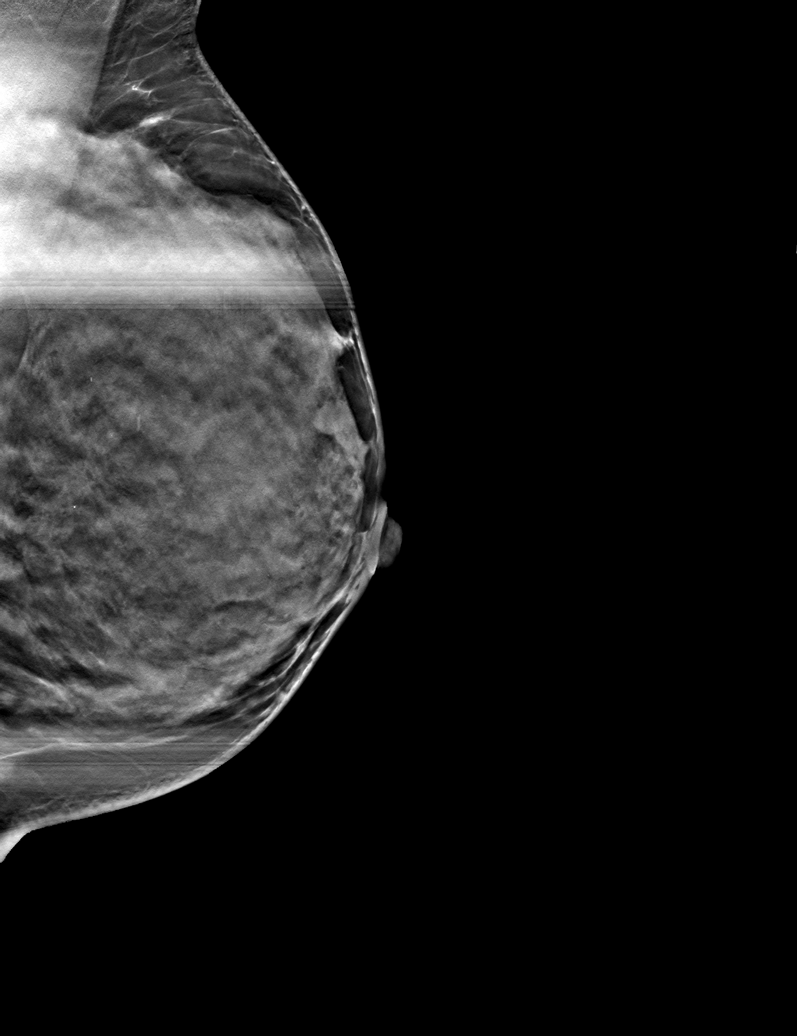

[6 of 30 positions shown; findings below may reference images not displayed]

ACR Breast Density Category d: The breast tissue is extremely dense,
which lowers the sensitivity of mammography.
FINDINGS: No focal or suspicious mammographic findings are identified within
the left breast. The parenchymal pattern is stable. Note is made of
diffuse scattered punctate and round calcifications, benign in
appearance.

Targeted ultrasound is performed, showing a cluster of anechoic
cysts which may represent focally dilated ducts at the 3 o'clock
position 3 cm from the nipple. It measures 1.8 x 1.5 x 0.7 cm.
Adjacent to this, there is an oval, circumscribed hypoechoic mass
with slightly ill-defined borders at the 3 o'clock position 4 cm
from the nipple. It measures 5 x 4 x 3 mm. There is no internal
vascularity. This may represent a small intraductal mass. Evaluation
of the left axilla demonstrates no suspicious lymphadenopathy.
IMPRESSION: 1. Indeterminate left breast mass at the 3 o'clock position 4 cm
from the nipple. This may be intraductal in nature. Recommendation
is for ultrasound-guided biopsy.
2. No suspicious left axillary lymphadenopathy.
3. No mammographic evidence of malignancy on the right.

RECOMMENDATION:
1. Ultrasound-guided biopsy of the left breast.
2. Following biopsy, consider further evaluation with breast MRI
given the patient's persistent/recurrent symptoms.

I have discussed the findings and recommendations with the patient.
If applicable, a reminder letter will be sent to the patient
regarding the next appointment.

BI-RADS CATEGORY  4: Suspicious.

## 2022-03-12 IMAGING — US US BREAST BX W LOC DEV 1ST LESION IMG BX SPEC US GUIDE*L*
1 series · 10 of 10 positions shown · non-contrast
Comparison: Previous exam(s).
COMPARISON: Previous exam(s).

Addendum:
CLINICAL DATA: Patient presents for ultrasound-guided core biopsy
of LEFT breast mass.

EXAM:
ULTRASOUND GUIDED LEFT BREAST CORE NEEDLE BIOPSY

[Series 1: us breast bx w loc dev 1st lesion img bx spec us g · 0.06mm/px · 10 of 10 slices shown]
[im 1/10]
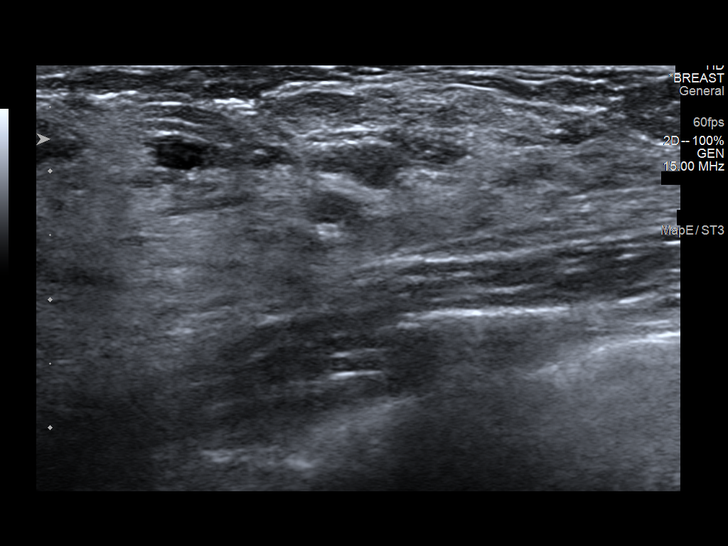
[im 2/10]
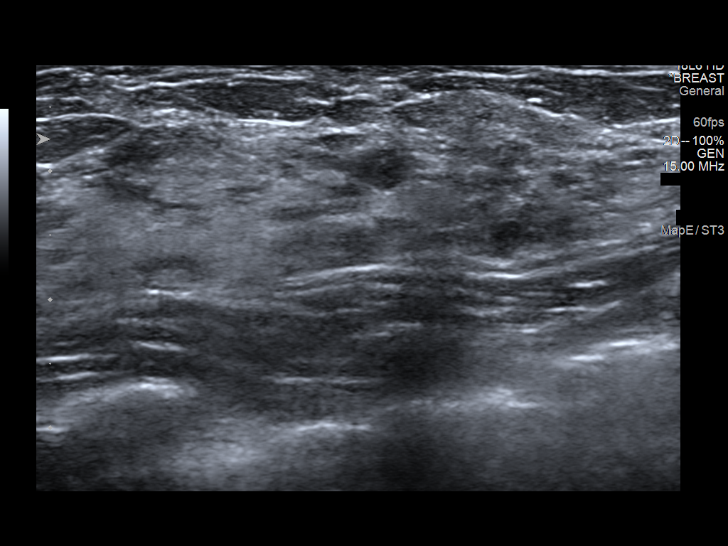
[im 3/10]
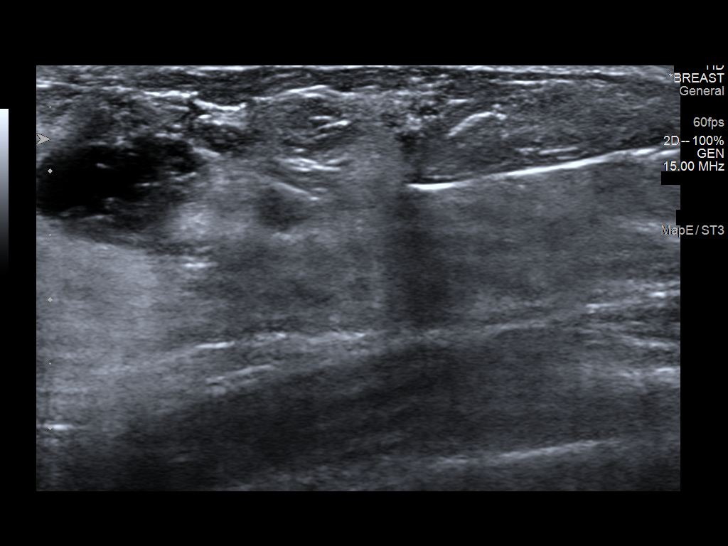
[im 4/10]
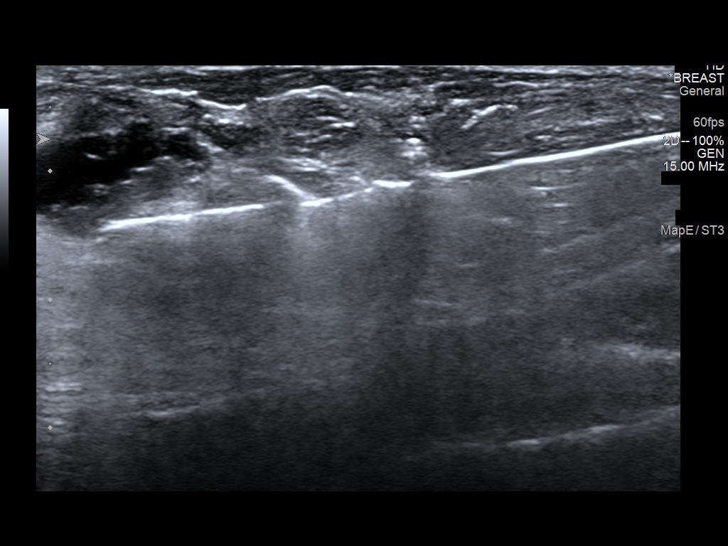
[im 5/10]
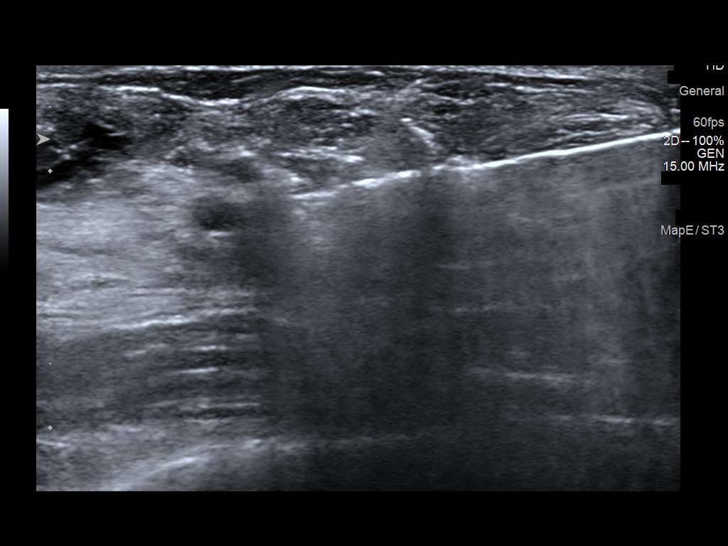
[im 6/10]
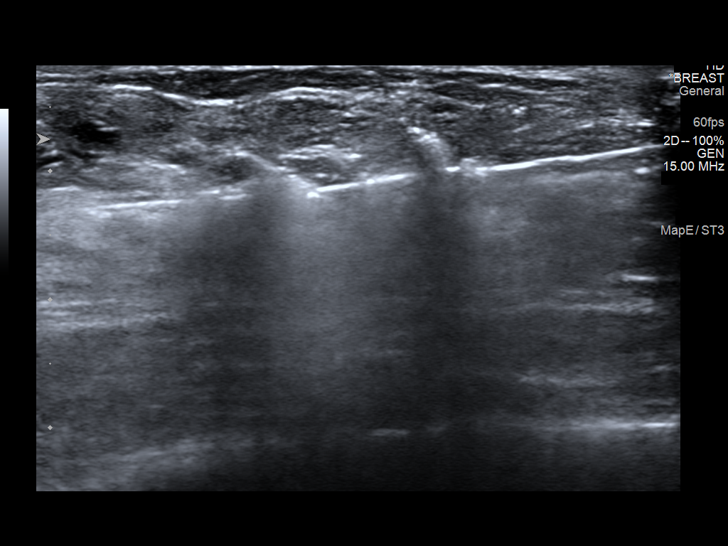
[im 7/10]
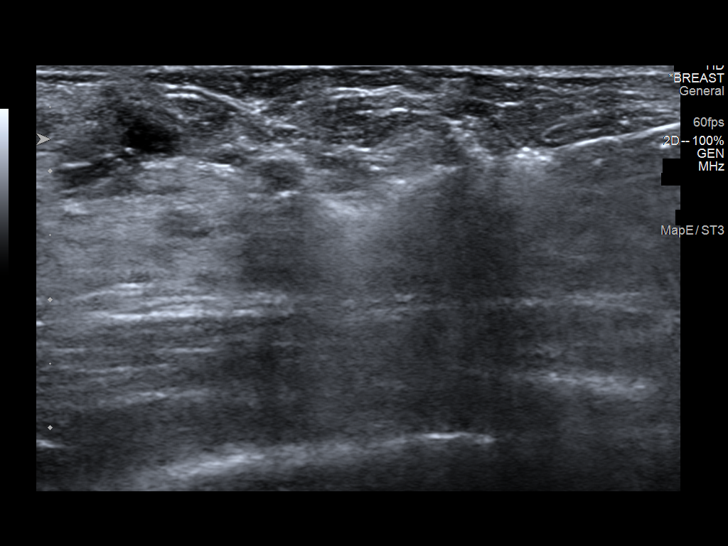
[im 8/10]
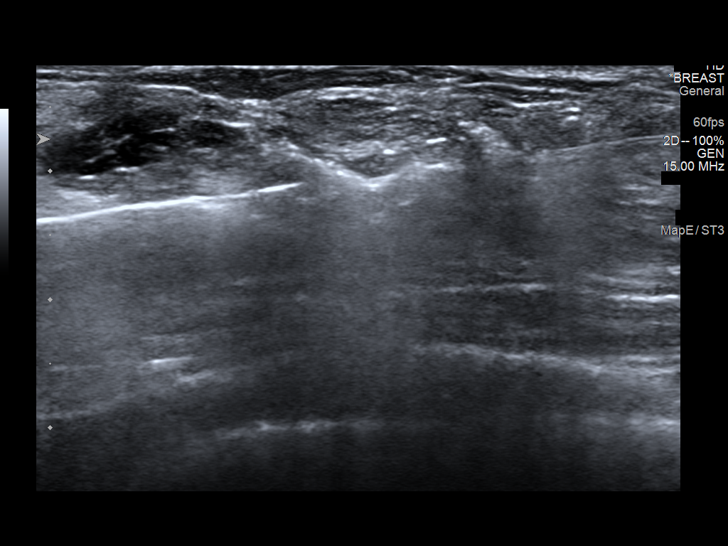
[im 9/10]
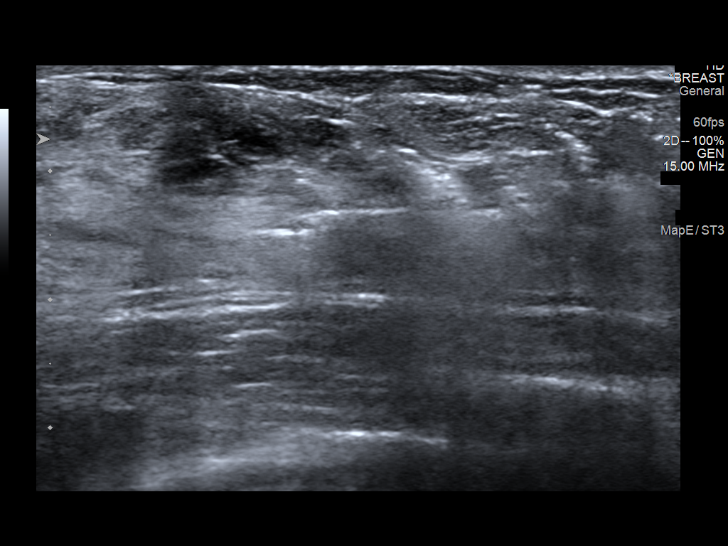
[im 10/10]
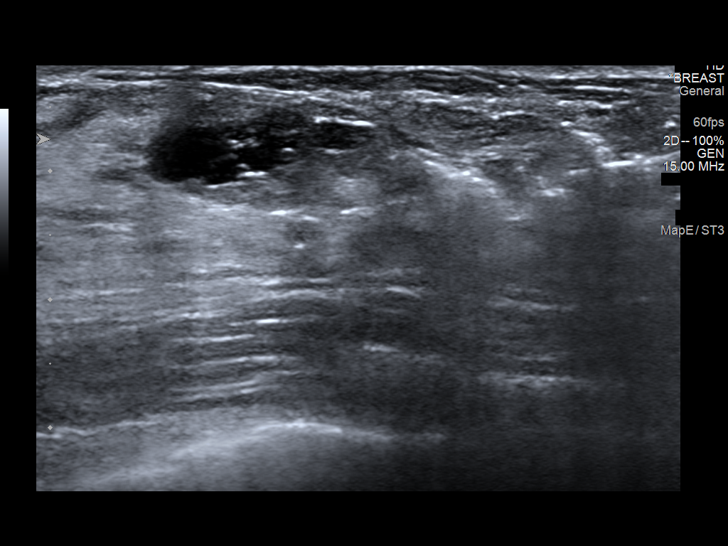

[10 of 10 positions shown; findings below may reference images not displayed]



Lesion quadrant: 3 o'clock LEFT breast

Using sterile technique and 1% Lidocaine as local anesthetic, under
direct ultrasound visualization, a 12 gauge Olav Magne device was
used to perform biopsy of mass in the 3 o'clock location of the LEFT
breast 4 centimeters from the nipple using a LATERAL to me approach.
At the conclusion of the procedure ribbon shaped tissue marker clip
was deployed into the biopsy cavity. Follow up 2 view mammogram was
performed and dictated separately.
IMPRESSION: Ultrasound guided biopsy of LEFT breast mass. No apparent
complications.

ADDENDUM:
Pathology revealed USUAL DUCTAL HYPERPLASIA, FIBROCYSTIC CHANGES
WITH APOCRINE METAPLASIA, PSEUDOANGIOMATOUS STROMAL HYPERPLASIA of
the LEFT breast, 3 o'clock, 5cmfn, (ribbon clip). This was found to
be concordant by Dr. Dvorana Mamut.

Pathology results were discussed with the patient by telephone. The
patient reported doing well after the biopsy with tenderness at the
site. Post biopsy instructions and care were reviewed and questions
were answered. The patient was encouraged to call The [REDACTED] for any additional concerns. My direct phone
number was provided.

The patient was instructed to return for annual screening
mammography in December 2021.

Recommendation for a bilateral breast MRI for further evaluation of
recurrent, spontaneous clear/yellow LEFT nipple discharge.

Pathology results reported by Injoon Sambrano, RN on 01/28/2021.



Lesion quadrant: 3 o'clock LEFT breast

Using sterile technique and 1% Lidocaine as local anesthetic, under
direct ultrasound visualization, a 12 gauge Olav Magne device was
used to perform biopsy of mass in the 3 o'clock location of the LEFT
breast 4 centimeters from the nipple using a LATERAL to me approach.
At the conclusion of the procedure ribbon shaped tissue marker clip
was deployed into the biopsy cavity. Follow up 2 view mammogram was
performed and dictated separately.
IMPRESSION: Ultrasound guided biopsy of LEFT breast mass. No apparent
complications.

## 2023-09-03 ENCOUNTER — Other Ambulatory Visit: Payer: Self-pay | Admitting: Family Medicine

## 2023-09-03 DIAGNOSIS — Z1231 Encounter for screening mammogram for malignant neoplasm of breast: Secondary | ICD-10-CM

## 2023-09-21 ENCOUNTER — Ambulatory Visit
Admission: RE | Admit: 2023-09-21 | Discharge: 2023-09-21 | Disposition: A | Discharge status: Home / Self Care | Payer: Self-pay | Source: Ambulatory Visit

## 2023-09-21 DIAGNOSIS — Z1231 Encounter for screening mammogram for malignant neoplasm of breast: Secondary | ICD-10-CM
